# Patient Record
Sex: Female | Born: 1960 | Race: Black or African American | Hispanic: No | Marital: Single | Smoking: Current every day smoker
Health system: Southern US, Community
[De-identification: ages and names within clinical notes are randomized; demographics above are authoritative.]

## PROBLEM LIST (undated history)

## (undated) DIAGNOSIS — J45909 Unspecified asthma, uncomplicated: Secondary | ICD-10-CM

## (undated) DIAGNOSIS — I10 Essential (primary) hypertension: Secondary | ICD-10-CM

## (undated) DIAGNOSIS — R002 Palpitations: Secondary | ICD-10-CM

## (undated) DIAGNOSIS — Z889 Allergy status to unspecified drugs, medicaments and biological substances status: Secondary | ICD-10-CM

## (undated) DIAGNOSIS — M3500 Sicca syndrome, unspecified: Secondary | ICD-10-CM

## (undated) HISTORY — PX: CHOLECYSTECTOMY: SHX55

## (undated) HISTORY — PX: NO PAST SURGERIES: SHX2092

---

## 2003-12-17 ENCOUNTER — Emergency Department: Payer: Self-pay | Admitting: Emergency Medicine

## 2004-01-24 ENCOUNTER — Emergency Department: Payer: Self-pay | Admitting: General Practice

## 2004-05-10 ENCOUNTER — Emergency Department: Payer: Self-pay | Admitting: Internal Medicine

## 2004-06-14 ENCOUNTER — Emergency Department: Payer: Self-pay | Admitting: Emergency Medicine

## 2004-10-25 ENCOUNTER — Emergency Department: Payer: Self-pay | Admitting: Emergency Medicine

## 2005-11-08 ENCOUNTER — Emergency Department: Payer: Self-pay | Admitting: Emergency Medicine

## 2006-02-04 ENCOUNTER — Emergency Department: Payer: Self-pay | Admitting: General Practice

## 2006-03-06 ENCOUNTER — Emergency Department: Payer: Self-pay | Admitting: Emergency Medicine

## 2006-06-02 ENCOUNTER — Emergency Department: Payer: Self-pay | Admitting: Emergency Medicine

## 2006-07-13 ENCOUNTER — Ambulatory Visit: Payer: Self-pay | Admitting: Internal Medicine

## 2006-07-25 ENCOUNTER — Ambulatory Visit: Payer: Self-pay | Admitting: Emergency Medicine

## 2006-11-11 ENCOUNTER — Ambulatory Visit: Payer: Self-pay | Admitting: Internal Medicine

## 2006-12-26 ENCOUNTER — Ambulatory Visit: Payer: Self-pay | Admitting: Family Medicine

## 2007-01-04 ENCOUNTER — Ambulatory Visit: Payer: Self-pay | Admitting: Family Medicine

## 2007-02-04 ENCOUNTER — Ambulatory Visit: Payer: Self-pay | Admitting: Internal Medicine

## 2007-02-24 ENCOUNTER — Ambulatory Visit: Payer: Self-pay | Admitting: Emergency Medicine

## 2007-04-23 ENCOUNTER — Ambulatory Visit: Payer: Self-pay | Admitting: Otolaryngology

## 2007-07-08 ENCOUNTER — Ambulatory Visit: Payer: Self-pay | Admitting: Sports Medicine

## 2007-10-27 ENCOUNTER — Ambulatory Visit: Payer: Self-pay | Admitting: Internal Medicine

## 2008-03-19 ENCOUNTER — Emergency Department: Payer: Self-pay | Admitting: Emergency Medicine

## 2008-10-31 ENCOUNTER — Ambulatory Visit: Payer: Self-pay | Admitting: Family Medicine

## 2009-07-13 ENCOUNTER — Ambulatory Visit: Payer: Self-pay | Admitting: Internal Medicine

## 2009-12-08 ENCOUNTER — Ambulatory Visit: Payer: Self-pay | Admitting: Internal Medicine

## 2010-02-19 ENCOUNTER — Observation Stay: Payer: Self-pay | Admitting: Specialist

## 2010-02-19 DIAGNOSIS — R55 Syncope and collapse: Secondary | ICD-10-CM

## 2010-03-27 DIAGNOSIS — M76899 Other specified enthesopathies of unspecified lower limb, excluding foot: Secondary | ICD-10-CM | POA: Insufficient documentation

## 2010-04-23 ENCOUNTER — Ambulatory Visit: Payer: Self-pay | Admitting: Internal Medicine

## 2010-12-30 ENCOUNTER — Emergency Department: Payer: Self-pay | Admitting: Unknown Physician Specialty

## 2011-03-24 ENCOUNTER — Emergency Department: Payer: Self-pay | Admitting: Emergency Medicine

## 2011-03-24 LAB — CBC
HCT: 38.2 % (ref 35.0–47.0)
HGB: 12.7 g/dL (ref 12.0–16.0)
MCH: 30.5 pg (ref 26.0–34.0)
MCHC: 33.2 g/dL (ref 32.0–36.0)
MCV: 92 fL (ref 80–100)
RDW: 14 % (ref 11.5–14.5)

## 2011-03-24 LAB — TROPONIN I: Troponin-I: <0.02 ng/mL

## 2011-03-24 LAB — BASIC METABOLIC PANEL
Anion Gap: 13 (ref 7–16)
BUN: 6 mg/dL — ABNORMAL LOW (ref 7–18)
Chloride: 106 mmol/L (ref 98–107)
Glucose: 106 mg/dL — ABNORMAL HIGH (ref 65–99)
Osmolality: 283 (ref 275–301)

## 2011-03-24 LAB — LIPASE, BLOOD: Lipase: 135 U/L (ref 73–393)

## 2011-04-27 ENCOUNTER — Emergency Department: Payer: Self-pay | Admitting: Emergency Medicine

## 2011-04-28 LAB — BASIC METABOLIC PANEL
BUN: 9 mg/dL (ref 7–18)
Calcium, Total: 8.9 mg/dL (ref 8.5–10.1)
Chloride: 107 mmol/L (ref 98–107)
Creatinine: 0.68 mg/dL (ref 0.60–1.30)
EGFR (African American): >60
EGFR (Non-African Amer.): >60
Potassium: 3.8 mmol/L (ref 3.5–5.1)
Sodium: 142 mmol/L (ref 136–145)

## 2011-04-28 LAB — CBC
MCHC: 33.5 g/dL (ref 32.0–36.0)
MCV: 92 fL (ref 80–100)
RBC: 4.3 10*6/uL (ref 3.80–5.20)
RDW: 13.8 % (ref 11.5–14.5)
WBC: 6.9 10*3/uL (ref 3.6–11.0)

## 2011-04-28 LAB — URINALYSIS, COMPLETE
Bilirubin,UR: NEGATIVE
Ketone: NEGATIVE
Specific Gravity: 1.013 (ref 1.003–1.030)
Squamous Epithelial: 5
WBC UR: 15 /HPF (ref 0–5)

## 2011-04-28 LAB — WET PREP, GENITAL

## 2011-07-09 ENCOUNTER — Ambulatory Visit: Payer: Self-pay | Admitting: Internal Medicine

## 2011-07-09 LAB — URINALYSIS, COMPLETE
Glucose,UR: NEGATIVE mg/dL (ref 0–75)
Ketone: NEGATIVE
Nitrite: NEGATIVE

## 2011-07-11 LAB — URINE CULTURE

## 2012-04-27 DIAGNOSIS — G4700 Insomnia, unspecified: Secondary | ICD-10-CM | POA: Insufficient documentation

## 2012-04-27 DIAGNOSIS — F329 Major depressive disorder, single episode, unspecified: Secondary | ICD-10-CM | POA: Insufficient documentation

## 2012-05-04 DIAGNOSIS — F332 Major depressive disorder, recurrent severe without psychotic features: Secondary | ICD-10-CM | POA: Insufficient documentation

## 2012-11-22 ENCOUNTER — Ambulatory Visit: Payer: Self-pay | Admitting: Physician Assistant

## 2013-04-01 DIAGNOSIS — F3289 Other specified depressive episodes: Secondary | ICD-10-CM | POA: Diagnosis not present

## 2013-04-01 DIAGNOSIS — M2240 Chondromalacia patellae, unspecified knee: Secondary | ICD-10-CM | POA: Diagnosis not present

## 2013-04-01 DIAGNOSIS — F329 Major depressive disorder, single episode, unspecified: Secondary | ICD-10-CM | POA: Diagnosis not present

## 2013-04-01 DIAGNOSIS — Z79899 Other long term (current) drug therapy: Secondary | ICD-10-CM | POA: Diagnosis not present

## 2013-04-01 DIAGNOSIS — M25569 Pain in unspecified knee: Secondary | ICD-10-CM | POA: Diagnosis not present

## 2013-04-01 DIAGNOSIS — IMO0002 Reserved for concepts with insufficient information to code with codable children: Secondary | ICD-10-CM | POA: Diagnosis not present

## 2013-04-01 DIAGNOSIS — J410 Simple chronic bronchitis: Secondary | ICD-10-CM | POA: Insufficient documentation

## 2013-04-01 DIAGNOSIS — M898X9 Other specified disorders of bone, unspecified site: Secondary | ICD-10-CM | POA: Diagnosis not present

## 2013-04-01 DIAGNOSIS — M7989 Other specified soft tissue disorders: Secondary | ICD-10-CM | POA: Diagnosis not present

## 2013-04-01 DIAGNOSIS — F17200 Nicotine dependence, unspecified, uncomplicated: Secondary | ICD-10-CM | POA: Insufficient documentation

## 2013-04-01 DIAGNOSIS — I872 Venous insufficiency (chronic) (peripheral): Secondary | ICD-10-CM | POA: Insufficient documentation

## 2013-04-01 DIAGNOSIS — M1710 Unilateral primary osteoarthritis, unspecified knee: Secondary | ICD-10-CM | POA: Diagnosis not present

## 2013-04-01 DIAGNOSIS — E663 Overweight: Secondary | ICD-10-CM | POA: Diagnosis not present

## 2013-04-01 DIAGNOSIS — J40 Bronchitis, not specified as acute or chronic: Secondary | ICD-10-CM | POA: Insufficient documentation

## 2013-04-01 DIAGNOSIS — M25469 Effusion, unspecified knee: Secondary | ICD-10-CM | POA: Diagnosis not present

## 2013-04-01 DIAGNOSIS — E669 Obesity, unspecified: Secondary | ICD-10-CM | POA: Insufficient documentation

## 2013-04-01 DIAGNOSIS — R609 Edema, unspecified: Secondary | ICD-10-CM | POA: Diagnosis not present

## 2013-05-11 DIAGNOSIS — H1045 Other chronic allergic conjunctivitis: Secondary | ICD-10-CM | POA: Diagnosis not present

## 2014-02-16 ENCOUNTER — Ambulatory Visit: Payer: Self-pay | Admitting: Physician Assistant

## 2014-02-16 DIAGNOSIS — J329 Chronic sinusitis, unspecified: Secondary | ICD-10-CM | POA: Diagnosis not present

## 2014-03-21 ENCOUNTER — Ambulatory Visit: Payer: Self-pay | Admitting: Physician Assistant

## 2014-03-21 DIAGNOSIS — Y92009 Unspecified place in unspecified non-institutional (private) residence as the place of occurrence of the external cause: Secondary | ICD-10-CM | POA: Diagnosis not present

## 2014-03-21 DIAGNOSIS — S79912A Unspecified injury of left hip, initial encounter: Secondary | ICD-10-CM | POA: Diagnosis not present

## 2014-03-21 DIAGNOSIS — Y999 Unspecified external cause status: Secondary | ICD-10-CM | POA: Diagnosis not present

## 2014-03-21 DIAGNOSIS — Y9389 Activity, other specified: Secondary | ICD-10-CM | POA: Diagnosis not present

## 2014-03-21 DIAGNOSIS — M25552 Pain in left hip: Secondary | ICD-10-CM | POA: Diagnosis not present

## 2014-03-21 DIAGNOSIS — F418 Other specified anxiety disorders: Secondary | ICD-10-CM | POA: Diagnosis not present

## 2014-03-21 DIAGNOSIS — X58XXXA Exposure to other specified factors, initial encounter: Secondary | ICD-10-CM | POA: Diagnosis not present

## 2014-07-09 ENCOUNTER — Encounter: Payer: Self-pay | Admitting: *Deleted

## 2014-07-09 ENCOUNTER — Emergency Department: Payer: Medicare Other

## 2014-07-09 ENCOUNTER — Emergency Department
Admission: EM | Admit: 2014-07-09 | Discharge: 2014-07-10 | Disposition: A | Discharge status: Home / Self Care | Payer: Medicare Other | Attending: Emergency Medicine | Admitting: Emergency Medicine

## 2014-07-09 ENCOUNTER — Other Ambulatory Visit: Payer: Self-pay

## 2014-07-09 DIAGNOSIS — R002 Palpitations: Secondary | ICD-10-CM | POA: Diagnosis not present

## 2014-07-09 DIAGNOSIS — R0789 Other chest pain: Secondary | ICD-10-CM | POA: Insufficient documentation

## 2014-07-09 DIAGNOSIS — R0602 Shortness of breath: Secondary | ICD-10-CM | POA: Diagnosis not present

## 2014-07-09 DIAGNOSIS — Z720 Tobacco use: Secondary | ICD-10-CM | POA: Diagnosis not present

## 2014-07-09 DIAGNOSIS — R079 Chest pain, unspecified: Secondary | ICD-10-CM

## 2014-07-09 LAB — COMPREHENSIVE METABOLIC PANEL
ALK PHOS: 59 U/L (ref 38–126)
ALT: 27 U/L (ref 14–54)
AST: 30 U/L (ref 15–41)
Albumin: 3.8 g/dL (ref 3.5–5.0)
Anion gap: 8 (ref 5–15)
BILIRUBIN TOTAL: 0.3 mg/dL (ref 0.3–1.2)
BUN: 12 mg/dL (ref 6–20)
CALCIUM: 8.7 mg/dL — AB (ref 8.9–10.3)
CO2: 24 mmol/L (ref 22–32)
Chloride: 103 mmol/L (ref 101–111)
Creatinine, Ser: 0.76 mg/dL (ref 0.44–1.00)
GFR calc Af Amer: >60 mL/min (ref >60)
GFR calc non Af Amer: >60 mL/min (ref >60)
Glucose, Bld: 91 mg/dL (ref 65–99)
Potassium: 3.8 mmol/L (ref 3.5–5.1)
SODIUM: 135 mmol/L (ref 135–145)
Total Protein: 7.3 g/dL (ref 6.5–8.1)

## 2014-07-09 LAB — URINALYSIS COMPLETE WITH MICROSCOPIC (ARMC ONLY)
BACTERIA UA: NONE SEEN
Bilirubin Urine: NEGATIVE
GLUCOSE, UA: NEGATIVE mg/dL
HGB URINE DIPSTICK: NEGATIVE
Ketones, ur: NEGATIVE mg/dL
NITRITE: NEGATIVE
PH: 6 (ref 5.0–8.0)
Protein, ur: NEGATIVE mg/dL
RBC / HPF: NONE SEEN RBC/hpf (ref 0–5)
Specific Gravity, Urine: 1.004 — ABNORMAL LOW (ref 1.005–1.030)

## 2014-07-09 LAB — CBC
HEMATOCRIT: 42.2 % (ref 35.0–47.0)
Hemoglobin: 14 g/dL (ref 12.0–16.0)
MCH: 29.8 pg (ref 26.0–34.0)
MCHC: 33.1 g/dL (ref 32.0–36.0)
MCV: 89.8 fL (ref 80.0–100.0)
Platelets: 312 10*3/uL (ref 150–440)
RBC: 4.7 MIL/uL (ref 3.80–5.20)
RDW: 14.1 % (ref 11.5–14.5)
WBC: 9.1 10*3/uL (ref 3.6–11.0)

## 2014-07-09 LAB — TROPONIN I: Troponin I: <0.03 ng/mL (ref <0.031)

## 2014-07-09 NOTE — ED Notes (Signed)
Pt presents w/ present c/o palpitations. Pt started having chest pressure while mopping floor at home. Pt was dyspneic and diaphoretic at that time. Pt presently denies pain, continues to c/o "fluttering" and slight dyspnea. Pt is presently not diaphoretic but is pale.

## 2014-07-10 ENCOUNTER — Other Ambulatory Visit: Payer: Self-pay

## 2014-07-10 LAB — TROPONIN I

## 2014-07-10 NOTE — ED Notes (Signed)
RN called lab to determine status of troponin that was sent at 0253.  Lab stated that sample was hemolyzed, no called to tell staff that another sample was needed. MD aware.

## 2014-07-10 NOTE — ED Notes (Signed)
Lab called and now stated that they can run sample.

## 2014-07-10 NOTE — ED Provider Notes (Signed)
[facility] Emergency Department Provider Note  ____________________________________________  Time seen: Approximately 2:03 AM  I have reviewed the triage vital signs and the nursing notes.   HISTORY  Chief Complaint Palpitations    HPI Karen Reed is a 54 y.o. female with a medical history only significant for tobacco abuse who presents after acute onset of palpitations, chest pressure, shortness of breath, and diaphoresis while mopping her floor.  She has never had symptoms like that in the past.  She felt like she got overheated while mopping, but her symptoms seemed more severe to her that she anticipated.  She describes the overall severity as moderate to severe at its worst.  The chest pressure lasted about 30 minutes until it resolved but the fluttering or palpitation continued for some time.She currently feels much better and is asymptomatic.   History reviewed. No pertinent past medical history.  There are no active problems to display for this patient.   History reviewed. No pertinent past surgical history.  No current outpatient prescriptions on file.  Allergies Codeine and Sulfa antibiotics  History reviewed. No pertinent family history.  Social History History  Substance Use Topics  . Smoking status: Current Every Day Smoker    Types: Cigarettes  . Smokeless tobacco: Never Used  . Alcohol Use: No    Review of Systems Constitutional: No fever/chills.  Diaphoresis during her episode Eyes: No visual changes. ENT: No sore throat. Cardiovascular: Moderate chest pressure lasting about 30 minutes. Respiratory: Shortness of breath while she was having palpitations. Gastrointestinal: No abdominal pain.  No nausea, no vomiting.  No diarrhea.  No constipation. Genitourinary: Negative for dysuria. Musculoskeletal: Negative for back pain. Skin: Negative for rash. Neurological: Negative for headaches, focal weakness or  numbness.  10-point ROS otherwise negative.  ____________________________________________   PHYSICAL EXAM:  VITAL SIGNS: ED Triage Vitals  Enc Vitals Group     BP 07/09/14 2253 132/94 mmHg     Pulse Rate 07/09/14 2253 111     Resp 07/09/14 2253 22     Temp 07/09/14 2253 98.9 F (37.2 C)     Temp Source 07/09/14 2253 Oral     SpO2 07/09/14 2253 96 %     Weight 07/09/14 2253 225 lb (102.059 kg)     Height 07/09/14 2253  (1.727 m)     Head Cir --      Peak Flow --      Pain Score --      Pain Loc --      Pain Edu? --      Excl. in GC? --     Constitutional: Alert and oriented. Well appearing and in no acute distress. Eyes: Conjunctivae are normal. PERRL. EOMI. Head: Atraumatic. Nose: No congestion/rhinnorhea. Mouth/Throat: Mucous membranes are moist.  Oropharynx non-erythematous. Neck: No stridor.   Cardiovascular: Normal rate, regular rhythm. Grossly normal heart sounds.  Good peripheral circulation. Respiratory: Normal respiratory effort.  No retractions. Lungs CTAB. Gastrointestinal: Soft and nontender. No distention. No abdominal bruits. No CVA tenderness. Musculoskeletal: No lower extremity tenderness nor edema.  No joint effusions. Neurologic:  Normal speech and language. No gross focal neurologic deficits are appreciated. Speech is normal. Skin:  Skin is warm, dry and intact. No rash noted. Psychiatric: Mood and affect are normal. Speech and behavior are normal.  ____________________________________________   LABS (all labs ordered are listed, but only abnormal results are displayed)  Labs Reviewed  COMPREHENSIVE METABOLIC PANEL - Abnormal; Notable for the following:  Calcium 8.7 (*)    All other components within normal limits  URINALYSIS COMPLETEWITH MICROSCOPIC ([facility] ONLY) - Abnormal; Notable for the following:    Color, Urine STRAW (*)    APPearance CLEAR (*)    Specific Gravity, Urine 1.004 (*)    Leukocytes, UA TRACE (*)    Squamous  Epithelial / LPF 0-5 (*)    All other components within normal limits  CBC  TROPONIN I  TROPONIN I   ____________________________________________  EKG  ED ECG REPORT I, Elder Davidian, the attending physician, personally viewed and interpreted this ECG.   Date: 07/09/2014  EKG Time: 22:55  Rate: 107  Rhythm: Accelerated junctional rhythm  Axis: Normal  Intervals:Cannot appreciate P waves  ST&T Change: Inverted T-wave in lead 3, otherwise unremarkable with no evidence of acute ischemia.  ED ECG REPORT #2 I, Grete Bosko, the attending physician, personally viewed and interpreted this ECG.   Date: 07/10/2014  EKG Time: 02:49  Rate: 79  Rhythm: normal sinus rhythm  Axis: Normal  Intervals:Normal  ST&T Change: None; lead 3 is very low voltage and is difficult to appreciate the T-wave  ____________________________________________  RADIOLOGY  I, Augusten Lipkin, personally viewed and evaluated these images as part of my medical decision making.   Dg Chest 2 View  07/09/2014   CLINICAL DATA:  Shortness of breath after working outside today. Smoker.  EXAM: CHEST  2 VIEW  COMPARISON:  Chest radiograph April 24, 2011  FINDINGS: Cardiomediastinal silhouette is normal. Diffuse bronchitic changes, no pleural effusion or focal consolidation. Normal lung volumes. No pneumothorax. Soft tissue planes and included osseous structure nonsuspicious.  IMPRESSION: Bronchitic changes without focal consolidation.   Electronically Signed   By: Awilda Metro M.D.   On: 07/09/2014 23:53    ____________________________________________   PROCEDURES  Procedure(s) performed: None  Critical Care performed: No ____________________________________________   INITIAL IMPRESSION / ASSESSMENT AND PLAN / ED COURSE  Pertinent labs & imaging results that were available during my care of the patient were reviewed by me and considered in my medical decision making (see chart for details).  The  patient has a HEART score of 3-4 based upon whether her history is considered moderately or highly suspicious.  This places her between low and moderate risk.  Her symptoms may represent unstable angina.  On the other hand, they may represent an episode of SVT, particularly since palpitations and fluttering are not atypical complaint associated with ACS.  I have discussed with her extensively the risks and benefits of chest pain observation in the hospital versus close outpatient follow-up.  We decided upon a second troponin and further observation in the emergency department.  If she remains asymptomatic, we will likely discharge with close outpatient follow-up on Monday.  She took 2 full dose aspirin prior to arrival in the emergency department.  She is hemodynamically stable.  ----------------------------------------- 4:29 AM on 07/10/2014 -----------------------------------------  The patient's second troponin is negative.  She states that she has been asymptomatic for the last several hours while awaiting her lab results.  I reiterated my offer to bring her into the hospital for chest pain, but she remains comfortable with the plan for outpatient follow-up, and I believe this is appropriate under the circumstances.  I trust that she will follow up as recommended.  I gave her my usual and customary return precautions.  ____________________________________________  FINAL CLINICAL IMPRESSION(S) / ED DIAGNOSES  Final diagnoses:  Chest pain, unspecified chest pain type  Heart palpitations  NEW MEDICATIONS STARTED DURING THIS VISIT:  New Prescriptions   No medications on file     Loleta Rose, MD 07/10/14 [phone]

## 2014-07-10 NOTE — Discharge Instructions (Signed)
You have been seen in the Emergency Department (ED) today for chest pressure and palpitations.  As we have discussed todays test results are normal, but you may require further testing.  Please follow up with the recommended doctor as instructed above in these documents regarding todays emergent visit and your recent symptoms to discuss further management.  Continue to take your regular medications. If you are not doing so already, please also take a daily baby aspirin (81 mg), at least until you follow up with your doctor.  Return to the Emergency Department (ED) if you experience any further chest pain/pressure/tightness, difficulty breathing, or sudden sweating, or other symptoms that concern you.   Chest Pain (Nonspecific) It is often hard to give a specific diagnosis for the cause of chest pain. There is always a chance that your pain could be related to something serious, such as a heart attack or a blood clot in the lungs. You need to follow up with your health care provider for further evaluation. CAUSES   Heartburn.  Pneumonia or bronchitis.  Anxiety or stress.  Inflammation around your heart (pericarditis) or lung (pleuritis or pleurisy).  A blood clot in the lung.  A collapsed lung (pneumothorax). It can develop suddenly on its own (spontaneous pneumothorax) or from trauma to the chest.  Shingles infection (herpes zoster virus). The chest wall is composed of bones, muscles, and cartilage. Any of these can be the source of the pain.  The bones can be bruised by injury.  The muscles or cartilage can be strained by coughing or overwork.  The cartilage can be affected by inflammation and become sore (costochondritis). DIAGNOSIS  Lab tests or other studies may be needed to find the cause of your pain. Your health care provider may have you take a test called an ambulatory electrocardiogram (ECG). An ECG records your heartbeat patterns over a 24-hour period. You may also have  other tests, such as:  Transthoracic echocardiogram (TTE). During echocardiography, sound waves are used to evaluate how blood flows through your heart.  Transesophageal echocardiogram (TEE).  Cardiac monitoring. This allows your health care provider to monitor your heart rate and rhythm in real time.  Holter monitor. This is a portable device that records your heartbeat and can help diagnose heart arrhythmias. It allows your health care provider to track your heart activity for several days, if needed.  Stress tests by exercise or by giving medicine that makes the heart beat faster. TREATMENT   Treatment depends on what may be causing your chest pain. Treatment may include:  Acid blockers for heartburn.  Anti-inflammatory medicine.  Pain medicine for inflammatory conditions.  Antibiotics if an infection is present.  You may be advised to change lifestyle habits. This includes stopping smoking and avoiding alcohol, caffeine, and chocolate.  You may be advised to keep your head raised (elevated) when sleeping. This reduces the chance of acid going backward from your stomach into your esophagus. Most of the time, nonspecific chest pain will improve within 2-3 days with rest and mild pain medicine.  HOME CARE INSTRUCTIONS   If antibiotics were prescribed, take them as directed. Finish them even if you start to feel better.  For the next few days, avoid physical activities that bring on chest pain. Continue physical activities as directed.  Do not use any tobacco products, including cigarettes, chewing tobacco, or electronic cigarettes.  Avoid drinking alcohol.  Only take medicine as directed by your health care provider.  Follow your health care provider's  suggestions for further testing if your chest pain does not go away.  Keep any follow-up appointments you made. If you do not go to an appointment, you could develop lasting (chronic) problems with pain. If there is any  problem keeping an appointment, call to reschedule. SEEK MEDICAL CARE IF:   Your chest pain does not go away, even after treatment.  You have a rash with blisters on your chest.  You have a fever. SEEK IMMEDIATE MEDICAL CARE IF:   You have increased chest pain or pain that spreads to your arm, neck, jaw, back, or abdomen.  You have shortness of breath.  You have an increasing cough, or you cough up blood.  You have severe back or abdominal pain.  You feel nauseous or vomit.  You have severe weakness.  You faint.  You have chills. This is an emergency. Do not wait to see if the pain will go away. Get medical help at once. Call your local emergency services (911 in [place] Do not drive yourself to the hospital. MAKE SURE YOU:   Understand these instructions.  Will watch your condition.  Will get help right away if you are not doing well or get worse. Document Released: 10/10/2004 Document Revised: 01/05/2013 Document Reviewed: 08/06/2007 [facility] Patient Information 2015 [place], [place]. This information is not intended to replace advice given to you by your health care provider. Make sure you discuss any questions you have with your health care provider.  Palpitations A palpitation is the feeling that your heartbeat is irregular. It may feel like your heart is fluttering or skipping a beat. It may also feel like your heart is beating faster than normal. This is usually not a serious problem. In some cases, you may need more medical tests. HOME CARE  Avoid:  Caffeine in coffee, tea, soft drinks, diet pills, and energy drinks.  Chocolate.  Alcohol.  Stop smoking if you smoke.  Reduce your stress and anxiety. Try:  A method that measures bodily functions so you can learn to control them (biofeedback).  Yoga.  Meditation.  Physical activity such as swimming, jogging, or walking.  Get plenty of rest and sleep. GET HELP IF:  Your fast or irregular heartbeat  continues after 24 hours.  Your palpitations occur more often. GET HELP RIGHT AWAY IF:   You have chest pain.  You feel short of breath.  You have a very bad headache.  You feel dizzy or pass out (faint). MAKE SURE YOU:   Understand these instructions.  Will watch your condition.  Will get help right away if you are not doing well or get worse. Document Released: 10/10/2007 Document Revised: 05/17/2013 Document Reviewed: 03/01/2011 [facility] Patient Information 2015 [place], [place]. This information is not intended to replace advice given to you by your health care provider. Make sure you discuss any questions you have with your health care provider.

## 2014-07-10 NOTE — ED Notes (Signed)
Pt alert and in NAD at time of d/c 

## 2014-07-11 DIAGNOSIS — Z720 Tobacco use: Secondary | ICD-10-CM | POA: Diagnosis not present

## 2014-07-11 DIAGNOSIS — R002 Palpitations: Secondary | ICD-10-CM | POA: Diagnosis not present

## 2014-07-11 DIAGNOSIS — R079 Chest pain, unspecified: Secondary | ICD-10-CM | POA: Insufficient documentation

## 2014-07-15 DIAGNOSIS — R002 Palpitations: Secondary | ICD-10-CM | POA: Diagnosis not present

## 2014-07-22 DIAGNOSIS — R079 Chest pain, unspecified: Secondary | ICD-10-CM | POA: Diagnosis not present

## 2014-08-04 DIAGNOSIS — R002 Palpitations: Secondary | ICD-10-CM | POA: Diagnosis not present

## 2014-08-04 DIAGNOSIS — R079 Chest pain, unspecified: Secondary | ICD-10-CM | POA: Diagnosis not present

## 2015-01-06 ENCOUNTER — Ambulatory Visit
Admission: EM | Admit: 2015-01-06 | Discharge: 2015-01-06 | Disposition: A | Discharge status: Home / Self Care | Payer: Medicare Other | Attending: Family Medicine | Admitting: Family Medicine

## 2015-01-06 ENCOUNTER — Encounter: Payer: Self-pay | Admitting: Emergency Medicine

## 2015-01-06 DIAGNOSIS — J069 Acute upper respiratory infection, unspecified: Secondary | ICD-10-CM | POA: Diagnosis not present

## 2015-01-06 MED ORDER — HYDROCOD POLST-CPM POLST ER 10-8 MG/5ML PO SUER: 5.0000 mL | Freq: Two times a day (BID) | ORAL | Status: DC

## 2015-01-06 MED ORDER — FLUTICASONE PROPIONATE 50 MCG/ACT NA SUSP: 2.0000 | Freq: Every day | NASAL | Status: DC

## 2015-01-06 NOTE — ED Notes (Signed)
Patient c/o sinus pressure and congestion and nasal congestion and HAs since yesterday.  Patient denies fevers.

## 2015-01-06 NOTE — Discharge Instructions (Signed)
Cool Mist Vaporizers °Vaporizers may help relieve the symptoms of a cough and cold. They add moisture to the air, which helps mucus to become thinner and less sticky. This makes it easier to breathe and cough up secretions. Cool mist vaporizers do not cause serious burns like hot mist vaporizers, which may also be called steamers or humidifiers. Vaporizers have not been proven to help with colds. You should not use a vaporizer if you are allergic to mold. °HOME CARE INSTRUCTIONS °· Follow the package instructions for the vaporizer. °· Do not use anything other than distilled water in the vaporizer. °· Do not run the vaporizer all of the time. This can cause mold or bacteria to grow in the vaporizer. °· Clean the vaporizer after each time it is used. °· Clean and dry the vaporizer well before storing it. °· Stop using the vaporizer if worsening respiratory symptoms develop. °  °This information is not intended to replace advice given to you by your health care provider. Make sure you discuss any questions you have with your health care provider. °  °Document Released: 09/28/2003 Document Revised: 01/05/2013 Document Reviewed: 05/20/2012 °Elsevier Interactive Patient Education ©2016 Elsevier Inc. ° °Upper Respiratory Infection, Adult °Most upper respiratory infections (URIs) are a viral infection of the air passages leading to the lungs. A URI affects the nose, throat, and upper air passages. The most common type of URI is nasopharyngitis and is typically referred to as "the common cold." °URIs run their course and usually go away on their own. Most of the time, a URI does not require medical attention, but sometimes a bacterial infection in the upper airways can follow a viral infection. This is called a secondary infection. Sinus and middle ear infections are common types of secondary upper respiratory infections. °Bacterial pneumonia can also complicate a URI. A URI can worsen asthma and chronic obstructive  pulmonary disease (COPD). Sometimes, these complications can require emergency medical care and may be life threatening.  °CAUSES °Almost all URIs are caused by viruses. A virus is a type of germ and can spread from one person to another.  °RISKS FACTORS °You may be at risk for a URI if:  °· You smoke.   °· You have chronic heart or lung disease. °· You have a weakened defense (immune) system.   °· You are very young or very old.   °· You have nasal allergies or asthma. °· You work in crowded or poorly ventilated areas. °· You work in health care facilities or schools. °SIGNS AND SYMPTOMS  °Symptoms typically develop 2-3 days after you come in contact with a cold virus. Most viral URIs last 7-10 days. However, viral URIs from the influenza virus (flu virus) can last 14-18 days and are typically more severe. Symptoms may include:  °· Runny or stuffy (congested) nose.   °· Sneezing.   °· Cough.   °· Sore throat.   °· Headache.   °· Fatigue.   °· Fever.   °· Loss of appetite.   °· Pain in your forehead, behind your eyes, and over your cheekbones (sinus pain). °· Muscle aches.   °DIAGNOSIS  °Your health care provider may diagnose a URI by: °· Physical exam. °· Tests to check that your symptoms are not due to another condition such as: °¨ Strep throat. °¨ Sinusitis. °¨ Pneumonia. °¨ Asthma. °TREATMENT  °A URI goes away on its own with time. It cannot be cured with medicines, but medicines may be prescribed or recommended to relieve symptoms. Medicines may help: °· Reduce your fever. °· Reduce   your cough.  Relieve nasal congestion. HOME CARE INSTRUCTIONS   Take medicines only as directed by your health care provider.   Gargle warm saltwater or take cough drops to comfort your throat as directed by your health care provider.  Use a warm mist humidifier or inhale steam from a shower to increase air moisture. This may make it easier to breathe.  Drink enough fluid to keep your urine clear or pale yellow.   Eat  soups and other clear broths and maintain good nutrition.   Rest as needed.   Return to work when your temperature has returned to normal or as your health care provider advises. You may need to stay home longer to avoid infecting others. You can also use a face mask and careful hand washing to prevent spread of the virus.  Increase the usage of your inhaler if you have asthma.   Do not use any tobacco products, including cigarettes, chewing tobacco, or electronic cigarettes. If you need help quitting, ask your health care provider. PREVENTION  The best way to protect yourself from getting a cold is to practice good hygiene.   Avoid oral or hand contact with people with cold symptoms.   Wash your hands often if contact occurs.  There is no clear evidence that vitamin C, vitamin E, echinacea, or exercise reduces the chance of developing a cold. However, it is always recommended to get plenty of rest, exercise, and practice good nutrition.  SEEK MEDICAL CARE IF:   You are getting worse rather than better.   Your symptoms are not controlled by medicine.   You have chills.  You have worsening shortness of breath.  You have brown or red mucus.  You have yellow or brown nasal discharge.  You have pain in your face, especially when you bend forward.  You have a fever.  You have swollen neck glands.  You have pain while swallowing.  You have white areas in the back of your throat. SEEK IMMEDIATE MEDICAL CARE IF:   You have severe or persistent:  Headache.  Ear pain.  Sinus pain.  Chest pain.  You have chronic lung disease and any of the following:  Wheezing.  Prolonged cough.  Coughing up blood.  A change in your usual mucus.  You have a stiff neck.  You have changes in your:  Vision.  Hearing.  Thinking.  Mood. MAKE SURE YOU:   Understand these instructions.  Will watch your condition.  Will get help right away if you are not doing well or  get worse.   This information is not intended to replace advice given to you by your health care provider. Make sure you discuss any questions you have with your health care provider.   Document Released: 06/26/2000 Document Revised: 05/17/2014 Document Reviewed: 04/07/2013 Elsevier Interactive Patient Education 2016 Elsevier Inc.  Upper Respiratory Infection, Adult Most upper respiratory infections (URIs) are a viral infection of the air passages leading to the lungs. A URI affects the nose, throat, and upper air passages. The most common type of URI is nasopharyngitis and is typically referred to as "the common cold." URIs run their course and usually go away on their own. Most of the time, a URI does not require medical attention, but sometimes a bacterial infection in the upper airways can follow a viral infection. This is called a secondary infection. Sinus and middle ear infections are common types of secondary upper respiratory infections. Bacterial pneumonia can also complicate a URI.  A URI can worsen asthma and chronic obstructive pulmonary disease (COPD). Sometimes, these complications can require emergency medical care and may be life threatening.  CAUSES Almost all URIs are caused by viruses. A virus is a type of germ and can spread from one person to another.  RISKS FACTORS You may be at risk for a URI if:   You smoke.   You have chronic heart or lung disease.  You have a weakened defense (immune) system.   You are very young or very old.   You have nasal allergies or asthma.  You work in crowded or poorly ventilated areas.  You work in health care facilities or schools. SIGNS AND SYMPTOMS  Symptoms typically develop 2-3 days after you come in contact with a cold virus. Most viral URIs last 7-10 days. However, viral URIs from the influenza virus (flu virus) can last 14-18 days and are typically more severe. Symptoms may include:   Runny or stuffy (congested) nose.    Sneezing.   Cough.   Sore throat.   Headache.   Fatigue.   Fever.   Loss of appetite.   Pain in your forehead, behind your eyes, and over your cheekbones (sinus pain).  Muscle aches.  DIAGNOSIS  Your health care provider may diagnose a URI by:  Physical exam.  Tests to check that your symptoms are not due to another condition such as:  Strep throat.  Sinusitis.  Pneumonia.  Asthma. TREATMENT  A URI goes away on its own with time. It cannot be cured with medicines, but medicines may be prescribed or recommended to relieve symptoms. Medicines may help:  Reduce your fever.  Reduce your cough.  Relieve nasal congestion. HOME CARE INSTRUCTIONS   Take medicines only as directed by your health care provider.   Gargle warm saltwater or take cough drops to comfort your throat as directed by your health care provider.  Use a warm mist humidifier or inhale steam from a shower to increase air moisture. This may make it easier to breathe.  Drink enough fluid to keep your urine clear or pale yellow.   Eat soups and other clear broths and maintain good nutrition.   Rest as needed.   Return to work when your temperature has returned to normal or as your health care provider advises. You may need to stay home longer to avoid infecting others. You can also use a face mask and careful hand washing to prevent spread of the virus.  Increase the usage of your inhaler if you have asthma.   Do not use any tobacco products, including cigarettes, chewing tobacco, or electronic cigarettes. If you need help quitting, ask your health care provider. PREVENTION  The best way to protect yourself from getting a cold is to practice good hygiene.   Avoid oral or hand contact with people with cold symptoms.   Wash your hands often if contact occurs.  There is no clear evidence that vitamin C, vitamin E, echinacea, or exercise reduces the chance of developing a cold.  However, it is always recommended to get plenty of rest, exercise, and practice good nutrition.  SEEK MEDICAL CARE IF:   You are getting worse rather than better.   Your symptoms are not controlled by medicine.   You have chills.  You have worsening shortness of breath.  You have brown or red mucus.  You have yellow or brown nasal discharge.  You have pain in your face, especially when you bend forward.  You have a fever.  You have swollen neck glands.  You have pain while swallowing.  You have white areas in the back of your throat. SEEK IMMEDIATE MEDICAL CARE IF:   You have severe or persistent:  Headache.  Ear pain.  Sinus pain.  Chest pain.  You have chronic lung disease and any of the following:  Wheezing.  Prolonged cough.  Coughing up blood.  A change in your usual mucus.  You have a stiff neck.  You have changes in your:  Vision.  Hearing.  Thinking.  Mood. MAKE SURE YOU:   Understand these instructions.  Will watch your condition.  Will get help right away if you are not doing well or get worse.   This information is not intended to replace advice given to you by your health care provider. Make sure you discuss any questions you have with your health care provider.   Document Released: 06/26/2000 Document Revised: 05/17/2014 Document Reviewed: 04/07/2013 Elsevier Interactive Patient Education [employer]2016 Elsevier Inc.

## 2015-01-06 NOTE — ED Provider Notes (Signed)
CSN: [id number]     Arrival date & time 01/06/15  1645 History   First MD Initiated Contact with Patient 01/06/15 1735     Chief Complaint  Patient presents with  . Facial Pain  . Nasal Congestion   (Consider location/radiation/quality/duration/timing/severity/associated sxs/prior Treatment) HPI   Is a 54 year old female complains of sinus pressure and congestion and a maxillary frontal headache that she states started yesterday. Have any fevers or chills. His had some brownish discharge from her nose. In addition she's had a cough probably from dripping from above and will sometimes keep her from sleeping well. She is afebrile today pulse 82 respirations 16 blood pressure 153/94 ( taking Alka-Seltzer cold medication) and O2 sat of 100% on room air.  History reviewed. No pertinent past medical history. History reviewed. No pertinent past surgical history. History reviewed. No pertinent family history. Social History  Substance Use Topics  . Smoking status: Current Every Day Smoker -- 0.50 packs/day    Types: Cigarettes  . Smokeless tobacco: Never Used  . Alcohol Use: No   OB History    No data available     Review of Systems  Constitutional: Negative for fever, chills, diaphoresis, activity change, appetite change and fatigue.  HENT: Positive for congestion, postnasal drip, rhinorrhea and sinus pressure.   Respiratory: Positive for cough.   All other systems reviewed and are negative.   Allergies  Codeine and Sulfa antibiotics  Home Medications   Prior to Admission medications   Medication Sig Start Date End Date Taking? Authorizing Provider  chlorpheniramine-HYDROcodone (TUSSIONEX PENNKINETIC ER) 10-8 MG/5ML SUER Take 5 mLs by mouth 2 (two) times daily. 01/06/15   Lutricia FeilWilliam P Budd Frei[place], PA-C  fluticasone (FLONASE) 50 MCG/ACT nasal spray Place 2 sprays into both nostrils daily. 01/06/15   Lutricia FeilWilliam P Alayzia Pavlock, PA-C   Meds Ordered and Administered this Visit  Medications - No data  to display  BP 153/94 mmHg  Pulse 82  Temp(Src) 98.1 F (36.7 C) (Tympanic)  Resp 16  Ht 5\' 8"  (1.727 m)  Wt 225 lb (102.059 kg)  BMI 34.22 kg/m2  SpO2 100%  LMP 12/30/2014 (Approximate) No data found.   Physical Exam  Constitutional: She is oriented to person, place, and time. She appears well-developed and well-nourished. No distress.  HENT:  Head: Normocephalic and atraumatic.  Right Ear: External ear normal.  Left Ear: External ear normal.  Nose: Nose normal.  Mouth/Throat: Oropharynx is clear and moist.  Eyes: Conjunctivae are normal. Pupils are equal, round, and reactive to light.  Neck: Normal range of motion. Neck supple.  Pulmonary/Chest: Effort normal and breath sounds normal. No respiratory distress. She has no wheezes. She has no rales.  Musculoskeletal: Normal range of motion. She exhibits no edema or tenderness.  Lymphadenopathy:    She has no cervical adenopathy.  Neurological: She is alert and oriented to person, place, and time.  Skin: Skin is warm and dry. She is not diaphoretic.  Psychiatric: She has a normal mood and affect. Her behavior is normal. Judgment and thought content normal.  Nursing note and vitals reviewed.   ED Course  Procedures (including critical care time)  Labs Review Labs Reviewed - No data to display  Imaging Review No results found.   Visual Acuity Review  Right Eye Distance:   Left Eye Distance:   Bilateral Distance:    Right Eye Near:   Left Eye Near:    Bilateral Near:         MDM  1. Acute URI    Discharge Medication List as of 01/06/2015  5:53 PM    START taking these medications   Details  chlorpheniramine-HYDROcodone (TUSSIONEX PENNKINETIC ER) 10-8 MG/5ML SUER Take 5 mLs by mouth 2 (two) times daily., Starting 01/06/2015, Until Discontinued, Print    fluticasone (FLONASE) 50 MCG/ACT nasal spray Place 2 sprays into both nostrils daily., Starting 01/06/2015, Until Discontinued, Print      Plan: 1.  Diagnosis reviewed with patient 2. rx as per orders; risks, benefits, potential side effects reviewed with patient 3. Recommend supportive treatment with fluids,rest,IBU /tylenol PRN. Try Afrin for 2 days until flonase starts effect. Not allergic to hydrocodone that she knows. 4. F/u prn if symptoms worsen or don't improve     Lutricia Feil, PA-C 01/06/15 1806

## 2015-02-23 ENCOUNTER — Ambulatory Visit
Admission: EM | Admit: 2015-02-23 | Discharge: 2015-02-23 | Disposition: A | Discharge status: Home / Self Care | Payer: Medicare Other | Attending: Family Medicine | Admitting: Family Medicine

## 2015-02-23 ENCOUNTER — Encounter: Payer: Self-pay | Admitting: Emergency Medicine

## 2015-02-23 DIAGNOSIS — M545 Low back pain, unspecified: Secondary | ICD-10-CM

## 2015-02-23 DIAGNOSIS — F17210 Nicotine dependence, cigarettes, uncomplicated: Secondary | ICD-10-CM | POA: Diagnosis not present

## 2015-02-23 DIAGNOSIS — M549 Dorsalgia, unspecified: Secondary | ICD-10-CM | POA: Diagnosis present

## 2015-02-23 LAB — URINALYSIS COMPLETE WITH MICROSCOPIC (ARMC ONLY)
BACTERIA UA: NONE SEEN
Bilirubin Urine: NEGATIVE
Glucose, UA: NEGATIVE mg/dL
Ketones, ur: NEGATIVE mg/dL
Leukocytes, UA: NEGATIVE
Nitrite: NEGATIVE
PH: 7 (ref 5.0–8.0)
Protein, ur: NEGATIVE mg/dL
Specific Gravity, Urine: 1.02 (ref 1.005–1.030)

## 2015-02-23 MED ORDER — CYCLOBENZAPRINE HCL 5 MG PO TABS: 5.0000 mg | ORAL_TABLET | Freq: Every day | ORAL | Status: DC

## 2015-02-23 MED ORDER — NAPROXEN 500 MG PO TABS: 500.0000 mg | ORAL_TABLET | Freq: Two times a day (BID) | ORAL | Status: DC

## 2015-02-23 NOTE — ED Notes (Signed)
Patient c/o left sided lower back pain for the past 3 days.  Patient denies fevers.  Patient denies N/V.

## 2015-02-23 NOTE — ED Provider Notes (Signed)
CSN: [id number]     Arrival date & time 02/23/15  1234 History   First MD Initiated Contact with Patient 02/23/15 1401     Chief Complaint  Patient presents with  . Back Pain   (Consider location/radiation/quality/duration/timing/severity/associated sxs/prior Treatment) HPI: Patient presents today with symptoms of left lower back pain. Patient states that she has had the symptoms for the last 3 days. She denies any injury or trauma to the area. She denies any fever. She denies any radicular symptoms. She has noticed some urinary frequency but no dysuria. She denies any known chronic back issues. Any position causes a symptoms to occur.  History reviewed. No pertinent past medical history. History reviewed. No pertinent past surgical history. History reviewed. No pertinent family history. Social History  Substance Use Topics  . Smoking status: Current Every Day Smoker -- 0.50 packs/day    Types: Cigarettes  . Smokeless tobacco: Never Used  . Alcohol Use: No   OB History    No data available     Review of Systems: Negative except mentioned above.   Allergies  Biaxin; Codeine; Levaquin; and Sulfa antibiotics  Home Medications   Prior to Admission medications   Medication Sig Start Date End Date Taking? Authorizing Provider  chlorpheniramine-HYDROcodone (TUSSIONEX PENNKINETIC ER) 10-8 MG/5ML SUER Take 5 mLs by mouth 2 (two) times daily. 01/06/15   Lutricia Feil, PA-C  fluticasone (FLONASE) 50 MCG/ACT nasal spray Place 2 sprays into both nostrils daily. 01/06/15   Lutricia Feil, PA-C   Meds Ordered and Administered this Visit  Medications - No data to display  BP 139/82 mmHg  Pulse 73  Temp(Src) 98 F (36.7 C) (Tympanic)  Resp 16  Ht 5' 7.5" (1.715 m)  Wt 225 lb (102.059 kg)  BMI 34.70 kg/m2  SpO2 100%  LMP 02/23/2015 (Exact Date) No data found.   Physical Exam   GENERAL: NAD HEENT: no pharyngeal erythema, no exudate, no erythema of TMs, no cervical LAD RESP:  CTA B CARD: RRR MSK: no midline tenderness, generalized tenderness in the left SI region, mild discomfort with flexion, negative SLR, 5/5 strength of LEs, nv intact  NEURO: CN II-XII grossly intact   ED Course  Procedures (including critical care time)  Labs Review Labs Reviewed  URINALYSIS COMPLETEWITH MICROSCOPIC [facility] ONLY)    Imaging Review No results found.    MDM  A/P: Left lower back pain/SI pain- we'll treat patient with Naprosyn and Flexeril, patient is currently spotting with her menstrual cycle which may be why she has blood in her urine. I've asked that she follow up with her primary care physician in have a UA done to make sure that this has resolved when she is not on her  menstrual cycle.  Ice/heat prn. If symptoms do persist or worsen I do recommend imaging of the area. Patient addresses understanding.   Jolene Provost, MD 02/23/15 [phone]

## 2015-02-25 LAB — URINE CULTURE: Special Requests: NORMAL

## 2015-04-10 ENCOUNTER — Ambulatory Visit
Admission: EM | Admit: 2015-04-10 | Discharge: 2015-04-10 | Disposition: A | Discharge status: Home / Self Care | Payer: Medicare Other | Attending: Emergency Medicine | Admitting: Emergency Medicine

## 2015-04-10 ENCOUNTER — Encounter: Payer: Self-pay | Admitting: Emergency Medicine

## 2015-04-10 DIAGNOSIS — F17210 Nicotine dependence, cigarettes, uncomplicated: Secondary | ICD-10-CM | POA: Diagnosis not present

## 2015-04-10 DIAGNOSIS — J069 Acute upper respiratory infection, unspecified: Secondary | ICD-10-CM | POA: Insufficient documentation

## 2015-04-10 DIAGNOSIS — R509 Fever, unspecified: Secondary | ICD-10-CM | POA: Diagnosis present

## 2015-04-10 DIAGNOSIS — R05 Cough: Secondary | ICD-10-CM | POA: Diagnosis present

## 2015-04-10 LAB — RAPID INFLUENZA A&B ANTIGENS (ARMC ONLY)
INFLUENZA A (ARMC): NEGATIVE
INFLUENZA B (ARMC): NEGATIVE

## 2015-04-10 MED ORDER — BENZONATATE 100 MG PO CAPS: 200.0000 mg | ORAL_CAPSULE | Freq: Three times a day (TID) | ORAL | Status: DC

## 2015-04-10 MED ORDER — ACETAMINOPHEN 325 MG PO TABS
650.0000 mg | ORAL_TABLET | Freq: Once | ORAL | Status: AC
  Administered 2015-04-10: 650 mg via ORAL

## 2015-04-10 MED ORDER — HYDROCOD POLST-CPM POLST ER 10-8 MG/5ML PO SUER: 5.0000 mL | Freq: Two times a day (BID) | ORAL | Status: DC

## 2015-04-10 NOTE — ED Provider Notes (Signed)
CSN: [id number]     Arrival date & time 04/10/15  6962 History   First MD Initiated Contact with Patient 04/10/15 1148     Chief Complaint  Patient presents with  . Cough  . Generalized Body Aches  . Fever   (Consider location/radiation/quality/duration/timing/severity/associated sxs/prior Treatment) HPI  This 55 year old female who presents with cough body aches and fever started 2 days ago. Did not take her temperature last night but had chills and hot episodes alternating. Today her fever is 99.3 pulse 89 respirations 16 blood pressure 149/78 and O2 sats 99% on room air. She does have a history of chronic bronchitis and sinusitis. She continues to smoke 1 pack cigarettes per day     History reviewed. No pertinent past medical history. History reviewed. No pertinent past surgical history. History reviewed. No pertinent family history. Social History  Substance Use Topics  . Smoking status: Current Every Day Smoker -- 0.50 packs/day    Types: Cigarettes  . Smokeless tobacco: Never Used  . Alcohol Use: No   OB History    No data available     Review of Systems  Constitutional: Positive for fever, chills, activity change and fatigue.  HENT: Positive for postnasal drip, rhinorrhea, sinus pressure and sore throat.   Respiratory: Positive for cough. Negative for shortness of breath, wheezing and stridor.   All other systems reviewed and are negative.   Allergies  Biaxin; Codeine; Levaquin; and Sulfa antibiotics  Home Medications   Prior to Admission medications   Medication Sig Start Date End Date Taking? Authorizing Provider  albuterol (PROVENTIL HFA;VENTOLIN HFA) 108 (90 Base) MCG/ACT inhaler Inhale 2 puffs into the lungs every 6 (six) hours as needed for wheezing or shortness of breath.   Yes Historical Provider, MD  benzonatate (TESSALON) 100 MG capsule Take 2 capsules (200 mg total) by mouth every 8 (eight) hours. 04/10/15   Lutricia Feil, PA-C   chlorpheniramine-HYDROcodone (TUSSIONEX PENNKINETIC ER) 10-8 MG/5ML SUER Take 5 mLs by mouth 2 (two) times daily. 04/10/15   Lutricia Feil, PA-C  cyclobenzaprine (FLEXERIL) 5 MG tablet Take 1 tablet (5 mg total) by mouth at bedtime. Prn muscle spasm. Can cause drowsiness. 02/23/15   Jolene Provost, MD  fluticasone (FLONASE) 50 MCG/ACT nasal spray Place 2 sprays into both nostrils daily. 01/06/15   Lutricia Feil, PA-C  naproxen (NAPROSYN) 500 MG tablet Take 1 tablet (500 mg total) by mouth 2 (two) times daily. 02/23/15   Jolene Provost, MD   Meds Ordered and Administered this Visit   Medications  acetaminophen (TYLENOL) tablet 650 mg (650 mg Oral Given 04/10/15 1121)    BP 149/78 mmHg  Pulse 89  Temp(Src) 99.3 F (37.4 C) (Tympanic)  Resp 16  Ht  (1.702 m)  Wt 220 lb (99.791 kg)  BMI 34.45 kg/m2  SpO2 99%  LMP 03/20/2015 (Approximate) No data found.   Physical Exam  Constitutional: She is oriented to person, place, and time. She appears well-developed and well-nourished. No distress.  HENT:  Head: Normocephalic and atraumatic.  Right Ear: External ear normal.  Left Ear: External ear normal.  Nose: Nose normal.  Mouth/Throat: Oropharynx is clear and moist. No oropharyngeal exudate.  Eyes: Conjunctivae are normal. Pupils are equal, round, and reactive to light.  Neck: Normal range of motion. Neck supple.  Pulmonary/Chest: Effort normal and breath sounds normal. No respiratory distress. She has no wheezes. She has no rales.  Musculoskeletal: Normal range of motion.  Lymphadenopathy:    She  has no cervical adenopathy.  Neurological: She is alert and oriented to person, place, and time.  Skin: Skin is warm and dry. She is not diaphoretic.  Psychiatric: She has a normal mood and affect. Her behavior is normal. Judgment and thought content normal.  Nursing note and vitals reviewed.   ED Course  Procedures (including critical care time)  Labs Review Labs Reviewed  RAPID  INFLUENZA A&B ANTIGENS [facility] ONLY)    Imaging Review No results found.   Visual Acuity Review  Right Eye Distance:   Left Eye Distance:   Bilateral Distance:    Right Eye Near:   Left Eye Near:    Bilateral Near:         MDM   1. Acute URI    Discharge Medication List as of 04/10/2015 12:06 PM    START taking these medications   Details  benzonatate (TESSALON) 100 MG capsule Take 2 capsules (200 mg total) by mouth every 8 (eight) hours., Starting 04/10/2015, Until Discontinued, Normal      Plan: 1. Test/x-ray results and diagnosis reviewed with patient 2. rx as per orders; risks, benefits, potential side effects reviewed with patient 3. Recommend supportive treatment with Rest and fluids as necessary. Advised to use Tylenol or Motrin for body aches and fever. Will prescribe Tussionex for her cough suppression. She does have an allergy to codeine in pill form for pain use but has taken Tussionex in the past without any problems. 4. F/u prn if symptoms worsen or don't improve     Lutricia FeilWilliam P Joenathan Sakuma, PA-C 04/10/15 1213

## 2015-04-10 NOTE — Discharge Instructions (Signed)
Cool Mist Vaporizers °Vaporizers may help relieve the symptoms of a cough and cold. They add moisture to the air, which helps mucus to become thinner and less sticky. This makes it easier to breathe and cough up secretions. Cool mist vaporizers do not cause serious burns like hot mist vaporizers, which may also be called steamers or humidifiers. Vaporizers have not been proven to help with colds. You should not use a vaporizer if you are allergic to mold. °HOME CARE INSTRUCTIONS °· Follow the package instructions for the vaporizer. °· Do not use anything other than distilled water in the vaporizer. °· Do not run the vaporizer all of the time. This can cause mold or bacteria to grow in the vaporizer. °· Clean the vaporizer after each time it is used. °· Clean and dry the vaporizer well before storing it. °· Stop using the vaporizer if worsening respiratory symptoms develop. °  °This information is not intended to replace advice given to you by your health care provider. Make sure you discuss any questions you have with your health care provider. °  °Document Released: 09/28/2003 Document Revised: 01/05/2013 Document Reviewed: 05/20/2012 °Elsevier Interactive Patient Education ©2016 [employer] ° °Upper Respiratory Infection, Adult °Most upper respiratory infections (URIs) are a viral infection of the air passages leading to the lungs. A URI affects the nose, throat, and upper air passages. The most common type of URI is nasopharyngitis and is typically referred to as "the common cold." °URIs run their course and usually go away on their own. Most of the time, a URI does not require medical attention, but sometimes a bacterial infection in the upper airways can follow a viral infection. This is called a secondary infection. Sinus and middle ear infections are common types of secondary upper respiratory infections. °Bacterial pneumonia can also complicate a URI. A URI can worsen asthma and chronic obstructive  pulmonary disease (COPD). Sometimes, these complications can require emergency medical care and may be life threatening.  °CAUSES °Almost all URIs are caused by viruses. A virus is a type of germ and can spread from one person to another.  °RISKS FACTORS °You may be at risk for a URI if:  °· You smoke.   °· You have chronic heart or lung disease. °· You have a weakened defense (immune) system.   °· You are very young or very old.   °· You have nasal allergies or asthma. °· You work in crowded or poorly ventilated areas. °· You work in health care facilities or schools. °SIGNS AND SYMPTOMS  °Symptoms typically develop 2-3 days after you come in contact with a cold virus. Most viral URIs last 7-10 days. However, viral URIs from the influenza virus (flu virus) can last 14-18 days and are typically more severe. Symptoms may include:  °· Runny or stuffy (congested) nose.   °· Sneezing.   °· Cough.   °· Sore throat.   °· Headache.   °· Fatigue.   °· Fever.   °· Loss of appetite.   °· Pain in your forehead, behind your eyes, and over your cheekbones (sinus pain). °· Muscle aches.   °DIAGNOSIS  °Your health care provider may diagnose a URI by: °· Physical exam. °· Tests to check that your symptoms are not due to another condition such as: °¨ Strep throat. °¨ Sinusitis. °¨ Pneumonia. °¨ Asthma. °TREATMENT  °A URI goes away on its own with time. It cannot be cured with medicines, but medicines may be prescribed or recommended to relieve symptoms. Medicines may help: °· Reduce your fever. °· Reduce   your cough. °· Relieve nasal congestion. °HOME CARE INSTRUCTIONS  °· Take medicines only as directed by your health care provider.   °· Gargle warm saltwater or take cough drops to comfort your throat as directed by your health care provider. °· Use a warm mist humidifier or inhale steam from a shower to increase air moisture. This may make it easier to breathe. °· Drink enough fluid to keep your urine clear or pale yellow.   °· Eat  soups and other clear broths and maintain good nutrition.   °· Rest as needed.   °· Return to work when your temperature has returned to normal or as your health care provider advises. You may need to stay home longer to avoid infecting others. You can also use a face mask and careful hand washing to prevent spread of the virus. °· Increase the usage of your inhaler if you have asthma.   °· Do not use any tobacco products, including cigarettes, chewing tobacco, or electronic cigarettes. If you need help quitting, ask your health care provider. °PREVENTION  °The best way to protect yourself from getting a cold is to practice good hygiene.  °· Avoid oral or hand contact with people with cold symptoms.   °· Wash your hands often if contact occurs.   °There is no clear evidence that vitamin C, vitamin E, echinacea, or exercise reduces the chance of developing a cold. However, it is always recommended to get plenty of rest, exercise, and practice good nutrition.  °SEEK MEDICAL CARE IF:  °· You are getting worse rather than better.   °· Your symptoms are not controlled by medicine.   °· You have chills. °· You have worsening shortness of breath. °· You have brown or red mucus. °· You have yellow or brown nasal discharge. °· You have pain in your face, especially when you bend forward. °· You have a fever. °· You have swollen neck glands. °· You have pain while swallowing. °· You have white areas in the back of your throat. °SEEK IMMEDIATE MEDICAL CARE IF:  °· You have severe or persistent: °¨ Headache. °¨ Ear pain. °¨ Sinus pain. °¨ Chest pain. °· You have chronic lung disease and any of the following: °¨ Wheezing. °¨ Prolonged cough. °¨ Coughing up blood. °¨ A change in your usual mucus. °· You have a stiff neck. °· You have changes in your: °¨ Vision. °¨ Hearing. °¨ Thinking. °¨ Mood. °MAKE SURE YOU:  °· Understand these instructions. °· Will watch your condition. °· Will get help right away if you are not doing well or  get worse. °  °This information is not intended to replace advice given to you by your health care provider. Make sure you discuss any questions you have with your health care provider. °  °Document Released: 06/26/2000 Document Revised: 05/17/2014 Document Reviewed: 04/07/2013 °Elsevier Interactive Patient Education ©2016 Else[employer] Inc. ° °

## 2015-04-10 NOTE — ED Notes (Signed)
Patient c/o cough, bodyaches, and fever that started 2 days ago.   

## 2015-04-11 ENCOUNTER — Encounter: Payer: Self-pay | Admitting: *Deleted

## 2015-04-11 ENCOUNTER — Ambulatory Visit: Payer: Medicare Other

## 2015-04-11 ENCOUNTER — Ambulatory Visit
Admission: EM | Admit: 2015-04-11 | Discharge: 2015-04-11 | Disposition: A | Discharge status: Home / Self Care | Payer: Medicare Other | Attending: Family Medicine | Admitting: Family Medicine

## 2015-04-11 DIAGNOSIS — R109 Unspecified abdominal pain: Secondary | ICD-10-CM | POA: Diagnosis present

## 2015-04-11 DIAGNOSIS — J069 Acute upper respiratory infection, unspecified: Secondary | ICD-10-CM | POA: Insufficient documentation

## 2015-04-11 DIAGNOSIS — R509 Fever, unspecified: Secondary | ICD-10-CM | POA: Diagnosis not present

## 2015-04-11 DIAGNOSIS — F17210 Nicotine dependence, cigarettes, uncomplicated: Secondary | ICD-10-CM | POA: Diagnosis not present

## 2015-04-11 DIAGNOSIS — R0602 Shortness of breath: Secondary | ICD-10-CM | POA: Diagnosis not present

## 2015-04-11 DIAGNOSIS — R05 Cough: Secondary | ICD-10-CM | POA: Diagnosis not present

## 2015-04-11 MED ORDER — PREDNISONE 20 MG PO TABS: ORAL_TABLET | ORAL | Status: DC

## 2015-04-11 MED ORDER — PROMETHAZINE-DM 6.25-15 MG/5ML PO SYRP: 5.0000 mL | ORAL_SOLUTION | Freq: Four times a day (QID) | ORAL | Status: DC | PRN

## 2015-04-11 NOTE — ED Provider Notes (Signed)
CSN: [id number]     Arrival date & time 04/11/15  [id number] History   First MD Initiated Contact with Patient 04/11/15 1045     Chief Complaint  Patient presents with  . Cough  . Pleurisy  . Back Pain  . Abdominal Pain   (Consider location/radiation/quality/duration/timing/severity/associated sxs/prior Treatment) HPI  This a 55 year old female who was seen here yesterday returns with the same symptoms that have worsened. SHe still has the same nonproductive cough having chest pain and soreness with her coughing and movement. She states that she's had a history of multiple pneumonias in the past and is concerned that this may be what is happening now. She has the same temperature that she had yesterday 99.3. Her O2 sats on room air slightly deep decreased at 97%. She states that she was not able to afford the Tussionex and is only been taken the [employer] which have not been beneficial.      History reviewed. No pertinent past medical history. History reviewed. No pertinent past surgical history. History reviewed. No pertinent family history. Social History  Substance Use Topics  . Smoking status: Current Every Day Smoker -- 0.50 packs/day    Types: Cigarettes  . Smokeless tobacco: Never Used  . Alcohol Use: No   OB History    No data available     Review of Systems  Constitutional: Positive for fever, chills, activity change and fatigue.  Respiratory: Positive for cough and shortness of breath.   All other systems reviewed and are negative.   Allergies  Biaxin; Codeine; Levaquin; and Sulfa antibiotics  Home Medications   Prior to Admission medications   Medication Sig Start Date End Date Taking? Authorizing Provider  albuterol (PROVENTIL HFA;VENTOLIN HFA) 108 (90 Base) MCG/ACT inhaler Inhale 2 puffs into the lungs every 6 (six) hours as needed for wheezing or shortness of breath.   Yes Historical Provider, MD  benzonatate (TESSALON) 100 MG capsule Take 2 capsules (200  mg total) by mouth every 8 (eight) hours. 04/10/15  Yes Lutricia FeilWilliam P Roemer, PA-C  fluticasone (FLONASE) 50 MCG/ACT nasal spray Place 2 sprays into both nostrils daily. 01/06/15  Yes Lutricia FeilWilliam P Roemer, PA-C  chlorpheniramine-HYDROcodone (TUSSIONEX PENNKINETIC ER) 10-8 MG/5ML SUER Take 5 mLs by mouth 2 (two) times daily. 04/10/15   Lutricia FeilWilliam P Roemer, PA-C  cyclobenzaprine (FLEXERIL) 5 MG tablet Take 1 tablet (5 mg total) by mouth at bedtime. Prn muscle spasm. Can cause drowsiness. 02/23/15   Jolene ProvostKirtida Patel, MD  naproxen (NAPROSYN) 500 MG tablet Take 1 tablet (500 mg total) by mouth 2 (two) times daily. 02/23/15   Jolene ProvostKirtida Patel, MD  predniSONE (DELTASONE) 20 MG tablet Take 2 tablets (40 mg) daily by mouth 04/11/15   Lutricia FeilWilliam P Roemer, PA-C  promethazine-dextromethorphan (PROMETHAZINE-DM) 6.25-15 MG/5ML syrup Take 5 mLs by mouth 4 (four) times daily as needed for cough. 04/11/15   Lutricia FeilWilliam P Roemer, PA-C   Meds Ordered and Administered this Visit  Medications - No data to display  BP 134/85 mmHg  Pulse 88  Temp(Src) 99.3 F (37.4 C) (Oral)  Resp 16  Ht 5\' 8"  (1.727 m)  Wt 220 lb (99.791 kg)  BMI 33.46 kg/m2  SpO2 97%  LMP 03/20/2015 (Approximate) No data found.   Physical Exam  Constitutional: She is oriented to person, place, and time. She appears well-developed and well-nourished. No distress.  HENT:  Head: Normocephalic and atraumatic.  Right Ear: External ear normal.  Left Ear: External ear normal.  Nose: Nose normal.  Mouth/Throat:  Oropharynx is clear and moist.  Eyes: Conjunctivae are normal. Pupils are equal, round, and reactive to light. Right eye exhibits no discharge. Left eye exhibits no discharge.  Neck: Normal range of motion. Neck supple.  Pulmonary/Chest: Effort normal and breath sounds normal. No respiratory distress. She has no wheezes. She has no rales.  Musculoskeletal: Normal range of motion. She exhibits no edema or tenderness.  Lymphadenopathy:    She has no cervical  adenopathy.  Neurological: She is alert and oriented to person, place, and time.  Skin: Skin is warm and dry. She is not diaphoretic.  Psychiatric: She has a normal mood and affect. Her behavior is normal. Judgment and thought content normal.  Nursing note and vitals reviewed.   ED Course  Procedures (including critical care time)  Labs Review Labs Reviewed - No data to display  Imaging Review Dg Chest 2 View  04/11/2015  CLINICAL DATA:  Cough and fever ; shortness of breath with exertion EXAM: CHEST  2 VIEW COMPARISON:  July 09, 2014 FINDINGS: There is no edema or consolidation. The heart size and pulmonary vascularity are normal. No adenopathy. No bone lesions. IMPRESSION: No edema or consolidation. Electronically Signed   By: Bretta Bang III M.D.   On: 04/11/2015 11:19     Visual Acuity Review  Right Eye Distance:   Left Eye Distance:   Bilateral Distance:    Right Eye Near:   Left Eye Near:    Bilateral Near:         MDM   1. Acute URI    Discharge Medication List as of 04/11/2015 12:11 PM    START taking these medications   Details  predniSONE (DELTASONE) 20 MG tablet Take 2 tablets (40 mg) daily by mouth, Normal    promethazine-dextromethorphan (PROMETHAZINE-DM) 6.25-15 MG/5ML syrup Take 5 mLs by mouth 4 (four) times daily as needed for cough., Starting 04/11/2015, Until Discontinued, Normal      Plan: 1. Test/x-ray results and diagnosis reviewed with patient 2. rx as per orders; risks, benefits, potential side effects reviewed with patient 3. Recommend supportive treatment with The same instructions as her last discharge. She is allergic to codeine cough syrup will not be appropriate for her. So I decided to give her a short dose of prednisone to help with her coughing. I've also told her that this is set to run its course and may take some time. She is not improving she should follow-up with her primary care physician or go to the emergency department. 4.  F/u prn if symptoms worsen or don't improve     Lutricia Feil, PA-C 04/11/15 2132

## 2015-04-11 NOTE — ED Notes (Signed)
Seen here yesterday. Here today with same symptoms but worse. C/o non-productive cough, chest pain/soreness with coughing and movement, also back and abd pain/soreness from cough. States hx of multiple pneumonias.

## 2015-04-11 NOTE — Discharge Instructions (Signed)
Cool Mist Vaporizers °Vaporizers may help relieve the symptoms of a cough and cold. They add moisture to the air, which helps mucus to become thinner and less sticky. This makes it easier to breathe and cough up secretions. Cool mist vaporizers do not cause serious burns like hot mist vaporizers, which may also be called steamers or humidifiers. Vaporizers have not been proven to help with colds. You should not use a vaporizer if you are allergic to mold. °HOME CARE INSTRUCTIONS °· Follow the package instructions for the vaporizer. °· Do not use anything other than distilled water in the vaporizer. °· Do not run the vaporizer all of the time. This can cause mold or bacteria to grow in the vaporizer. °· Clean the vaporizer after each time it is used. °· Clean and dry the vaporizer well before storing it. °· Stop using the vaporizer if worsening respiratory symptoms develop. °  °This information is not intended to replace advice given to you by your health care provider. Make sure you discuss any questions you have with your health care provider. °  °Document Released: 09/28/2003 Document Revised: 01/05/2013 Document Reviewed: 05/20/2012 °Elsevier Interactive Patient Education ©2016 [employer] ° °Upper Respiratory Infection, Adult °Most upper respiratory infections (URIs) are a viral infection of the air passages leading to the lungs. A URI affects the nose, throat, and upper air passages. The most common type of URI is nasopharyngitis and is typically referred to as "the common cold." °URIs run their course and usually go away on their own. Most of the time, a URI does not require medical attention, but sometimes a bacterial infection in the upper airways can follow a viral infection. This is called a secondary infection. Sinus and middle ear infections are common types of secondary upper respiratory infections. °Bacterial pneumonia can also complicate a URI. A URI can worsen asthma and chronic obstructive  pulmonary disease (COPD). Sometimes, these complications can require emergency medical care and may be life threatening.  °CAUSES °Almost all URIs are caused by viruses. A virus is a type of germ and can spread from one person to another.  °RISKS FACTORS °You may be at risk for a URI if:  °· You smoke.   °· You have chronic heart or lung disease. °· You have a weakened defense (immune) system.   °· You are very young or very old.   °· You have nasal allergies or asthma. °· You work in crowded or poorly ventilated areas. °· You work in health care facilities or schools. °SIGNS AND SYMPTOMS  °Symptoms typically develop 2-3 days after you come in contact with a cold virus. Most viral URIs last 7-10 days. However, viral URIs from the influenza virus (flu virus) can last 14-18 days and are typically more severe. Symptoms may include:  °· Runny or stuffy (congested) nose.   °· Sneezing.   °· Cough.   °· Sore throat.   °· Headache.   °· Fatigue.   °· Fever.   °· Loss of appetite.   °· Pain in your forehead, behind your eyes, and over your cheekbones (sinus pain). °· Muscle aches.   °DIAGNOSIS  °Your health care provider may diagnose a URI by: °· Physical exam. °· Tests to check that your symptoms are not due to another condition such as: °¨ Strep throat. °¨ Sinusitis. °¨ Pneumonia. °¨ Asthma. °TREATMENT  °A URI goes away on its own with time. It cannot be cured with medicines, but medicines may be prescribed or recommended to relieve symptoms. Medicines may help: °· Reduce your fever. °· Reduce   your cough. °· Relieve nasal congestion. °HOME CARE INSTRUCTIONS  °· Take medicines only as directed by your health care provider.   °· Gargle warm saltwater or take cough drops to comfort your throat as directed by your health care provider. °· Use a warm mist humidifier or inhale steam from a shower to increase air moisture. This may make it easier to breathe. °· Drink enough fluid to keep your urine clear or pale yellow.   °· Eat  soups and other clear broths and maintain good nutrition.   °· Rest as needed.   °· Return to work when your temperature has returned to normal or as your health care provider advises. You may need to stay home longer to avoid infecting others. You can also use a face mask and careful hand washing to prevent spread of the virus. °· Increase the usage of your inhaler if you have asthma.   °· Do not use any tobacco products, including cigarettes, chewing tobacco, or electronic cigarettes. If you need help quitting, ask your health care provider. °PREVENTION  °The best way to protect yourself from getting a cold is to practice good hygiene.  °· Avoid oral or hand contact with people with cold symptoms.   °· Wash your hands often if contact occurs.   °There is no clear evidence that vitamin C, vitamin E, echinacea, or exercise reduces the chance of developing a cold. However, it is always recommended to get plenty of rest, exercise, and practice good nutrition.  °SEEK MEDICAL CARE IF:  °· You are getting worse rather than better.   °· Your symptoms are not controlled by medicine.   °· You have chills. °· You have worsening shortness of breath. °· You have brown or red mucus. °· You have yellow or brown nasal discharge. °· You have pain in your face, especially when you bend forward. °· You have a fever. °· You have swollen neck glands. °· You have pain while swallowing. °· You have white areas in the back of your throat. °SEEK IMMEDIATE MEDICAL CARE IF:  °· You have severe or persistent: °¨ Headache. °¨ Ear pain. °¨ Sinus pain. °¨ Chest pain. °· You have chronic lung disease and any of the following: °¨ Wheezing. °¨ Prolonged cough. °¨ Coughing up blood. °¨ A change in your usual mucus. °· You have a stiff neck. °· You have changes in your: °¨ Vision. °¨ Hearing. °¨ Thinking. °¨ Mood. °MAKE SURE YOU:  °· Understand these instructions. °· Will watch your condition. °· Will get help right away if you are not doing well or  get worse. °  °This information is not intended to replace advice given to you by your health care provider. Make sure you discuss any questions you have with your health care provider. °  °Document Released: 06/26/2000 Document Revised: 05/17/2014 Document Reviewed: 04/07/2013 °Elsevier Interactive Patient Education ©2016 Else[employer] Inc. ° °

## 2015-04-12 ENCOUNTER — Encounter: Payer: Self-pay | Admitting: *Deleted

## 2015-04-12 ENCOUNTER — Emergency Department
Admission: EM | Admit: 2015-04-12 | Discharge: 2015-04-12 | Disposition: A | Discharge status: Home / Self Care | Payer: Medicare Other | Attending: Emergency Medicine | Admitting: Emergency Medicine

## 2015-04-12 DIAGNOSIS — Z881 Allergy status to other antibiotic agents status: Secondary | ICD-10-CM | POA: Insufficient documentation

## 2015-04-12 DIAGNOSIS — R05 Cough: Secondary | ICD-10-CM | POA: Insufficient documentation

## 2015-04-12 DIAGNOSIS — F17210 Nicotine dependence, cigarettes, uncomplicated: Secondary | ICD-10-CM | POA: Insufficient documentation

## 2015-04-12 DIAGNOSIS — Z885 Allergy status to narcotic agent status: Secondary | ICD-10-CM | POA: Diagnosis not present

## 2015-04-12 DIAGNOSIS — R059 Cough, unspecified: Secondary | ICD-10-CM

## 2015-04-12 MED ORDER — DOXYCYCLINE HYCLATE 100 MG PO TABS: 100.0000 mg | ORAL_TABLET | Freq: Two times a day (BID) | ORAL | Status: DC

## 2015-04-12 MED ORDER — ALBUTEROL SULFATE HFA 108 (90 BASE) MCG/ACT IN AERS: 2.0000 | INHALATION_SPRAY | RESPIRATORY_TRACT | Status: DC | PRN

## 2015-04-12 MED ORDER — BENZONATATE 200 MG PO CAPS
200.0000 mg | ORAL_CAPSULE | Freq: Three times a day (TID) | ORAL | Status: DC | PRN
Start: 2015-04-12 — End: 2016-01-10

## 2015-04-12 NOTE — Discharge Instructions (Signed)
Cough, Adult Coughing is a reflex that clears your throat and your airways. Coughing helps to heal and protect your lungs. It is normal to cough occasionally, but a cough that happens with other symptoms or lasts a long time may be a sign of a condition that needs treatment. A cough may last only 2-3 weeks (acute), or it may last longer than 8 weeks (chronic). CAUSES Coughing is commonly caused by:  Breathing in substances that irritate your lungs.  A viral or bacterial respiratory infection.  Allergies.  Asthma.  Postnasal drip.  Smoking.  Acid backing up from the stomach into the esophagus (gastroesophageal reflux).  Certain medicines.  Chronic lung problems, including COPD (or rarely, lung cancer).  Other medical conditions such as heart failure. HOME CARE INSTRUCTIONS  Pay attention to any changes in your symptoms. Take these actions to help with your discomfort:  Take medicines only as told by your health care provider.  If you were prescribed an antibiotic medicine, take it as told by your health care provider. Do not stop taking the antibiotic even if you start to feel better.  Talk with your health care provider before you take a cough suppressant medicine.  Drink enough fluid to keep your urine clear or pale yellow.  If the air is dry, use a cold steam vaporizer or humidifier in your bedroom or your home to help loosen secretions.  Avoid anything that causes you to cough at work or at home.  If your cough is worse at night, try sleeping in a semi-upright position.  Avoid cigarette smoke. If you smoke, quit smoking. If you need help quitting, ask your health care provider.  Avoid caffeine.  Avoid alcohol.  Rest as needed. SEEK MEDICAL CARE IF:   You have new symptoms.  You cough up pus.  Your cough does not get better after 2-3 weeks, or your cough gets worse.  You cannot control your cough with suppressant medicines and you are losing sleep.  You  develop pain that is getting worse or pain that is not controlled with pain medicines.  You have a fever.  You have unexplained weight loss.  You have night sweats. SEEK IMMEDIATE MEDICAL CARE IF:  You cough up blood.  You have difficulty breathing.  Your heartbeat is very fast.   This information is not intended to replace advice given to you by your health care provider. Make sure you discuss any questions you have with your health care provider.   Upper Respiratory Infection, Adult  Most upper respiratory infections (URIs) are a viral infection of the air passages leading to the lungs. A URI affects the nose, throat, and upper air passages. The most common type of URI is nasopharyngitis and is typically referred to as "the common cold."  URIs run their course and usually go away on their own. Most of the time, a URI does not require medical attention, but sometimes a bacterial infection in the upper airways can follow a viral infection. This is called a secondary infection. Sinus and middle ear infections are common types of secondary upper respiratory infections.    Bacterial pneumonia can also complicate a URI. A URI can worsen asthma and chronic obstructive pulmonary disease (COPD). Sometimes, these complications can require emergency medical care and may be life threatening.  CAUSES  Almost all URIs are caused by viruses. A virus is a type of germ and can spread from one person to another.  RISKS FACTORS  You may be at  risk for a URI if:  You smoke.  You have chronic heart or lung disease.  You have a weakened defense (immune) system.  You are very young or very old.  You have nasal allergies or asthma.  You work in crowded or poorly ventilated areas.  You work in health care facilities or schools. SIGNS AND SYMPTOMS  Symptoms typically develop 2-3 days after you come in contact with a cold virus. Most viral URIs last 7-10 days. However, viral URIs from the influenza virus  (flu virus) can last 14-18 days and are typically more severe. Symptoms may include:  Runny or stuffy (congested) nose.  Sneezing.  Cough.  Sore throat.  Headache.  Fatigue.  Fever.  Loss of appetite.  Pain in your forehead, behind your eyes, and over your cheekbones (sinus pain).  Muscle aches.  DIAGNOSIS  Your health care provider may diagnose a URI by:  Physical exam.  Tests to check that your symptoms are not due to another condition such as:  Strep throat.  Sinusitis.  Pneumonia.  Asthma. TREATMENT  A URI goes away on its own with time. It cannot be cured with medicines, but medicines may be prescribed or recommended to relieve symptoms. Medicines may help:  Reduce your fever.  Reduce your cough.  Relieve nasal congestion. HOME CARE INSTRUCTIONS  Take medicines only as directed by your health care provider.  Gargle warm saltwater or take cough drops to comfort your throat as directed by your health care provider.  Use a warm mist humidifier or inhale steam from a shower to increase air moisture. This may make it easier to breathe.  Drink enough fluid to keep your urine clear or pale yellow.  Eat soups and other clear broths and maintain good nutrition.  Rest as needed.  Return to work when your temperature has returned to normal or as your health care provider advises. You may need to stay home longer to avoid infecting others. You can also use a face mask and careful hand washing to prevent spread of the virus.  Increase the usage of your inhaler if you have asthma.  Do not use any tobacco products, including cigarettes, chewing tobacco, or electronic cigarettes. If you need help quitting, ask your health care provider. PREVENTION  The best way to protect yourself from getting a cold is to practice good hygiene.  Avoid oral or hand contact with people with cold symptoms.  Wash your hands often if contact occurs.  There is no clear evidence that vitamin C, vitamin E,  echinacea, or exercise reduces the chance of developing a cold. However, it is always recommended to get plenty of rest, exercise, and practice good nutrition.  SEEK MEDICAL CARE IF:  You are getting worse rather than better.  Your symptoms are not controlled by medicine.  You have chills.  You have worsening shortness of breath.  You have brown or red mucus.  You have yellow or brown nasal discharge.  You have pain in your face, especially when you bend forward.  You have a fever.  You have swollen neck glands.  You have pain while swallowing.  You have white areas in the back of your throat. SEEK IMMEDIATE MEDICAL CARE IF:  You have severe or persistent:  Headache.  Ear pain.  Sinus pain.  Chest pain. You have chronic lung disease and any of the following:  Wheezing.  Prolonged cough.  Coughing up blood.  A change in your usual mucus. You have a stiff  neck.  You have changes in your:  Vision.  Hearing.  Thinking.  Mood. MAKE SURE YOU:  Understand these instructions.  Will watch your condition.  Will get help right away if you are not doing well or get worse. This information is not intended to replace advice given to you by your health care provider. Make sure you discuss any questions you have with your health care provider.  Document Released: 06/26/2000 Document Revised: 05/17/2014 Document Reviewed: 04/07/2013  Elsevier Interactive Patient Education 2016 [employer]     Document Released: 06/29/2010 Document Revised: 09/21/2014 Document Reviewed: 03/09/2014 Elsevier Interactive Patient Education [employer]

## 2015-04-12 NOTE — ED Notes (Signed)
Pt complains of cough, congestion and fever for 5 days

## 2015-04-12 NOTE — ED Notes (Signed)
Pt states has been seen at urgent care twice. Pt given prescriptions for promethazine syrup and prednisone. Pt states these make her feel cold inside. Pt states she wants an antibiotic.

## 2015-04-12 NOTE — ED Provider Notes (Signed)
[facility] Emergency Department Provider Note  ____________________________________________  Time seen: Approximately 7:15 PM  I have reviewed the triage vital signs and the nursing notes.   HISTORY  Chief Complaint Cough    HPI Karen Reed is a 55 y.o. female who presents emergency department for a cough. Patient has been seen at an urgent care twice prior and was diagnosed with viral upper respiratory infection. Patient was given cough syrup and prednisone. Patient states that the cough syrup has made her feel "cold" inside. She has ceased to take cough medication. Patient had negative flu and strep test. She has also had a negative x-ray for any pneumonia. Patient denies any fevers or chills, neck pain, chest pain, abdominal pain, nausea or vomiting. Patient does endorse some mild shortness of breath during her coughing episodes. Patient is requesting antibiotics.   History reviewed. No pertinent past medical history.  There are no active problems to display for this patient.   History reviewed. No pertinent past surgical history.  Current Outpatient Rx  Name  Route  Sig  Dispense  Refill  . albuterol (PROVENTIL HFA;VENTOLIN HFA) 108 (90 Base) MCG/ACT inhaler   Inhalation   Inhale 2 puffs into the lungs every 4 (four) hours as needed for wheezing or shortness of breath.   1 Inhaler   0   . benzonatate (TESSALON) 200 MG capsule   Oral   Take 1 capsule (200 mg total) by mouth 3 (three) times daily as needed for cough.   21 capsule   0   . chlorpheniramine-HYDROcodone (TUSSIONEX PENNKINETIC ER) 10-8 MG/5ML SUER   Oral   Take 5 mLs by mouth 2 (two) times daily.   115 mL   0   . cyclobenzaprine (FLEXERIL) 5 MG tablet   Oral   Take 1 tablet (5 mg total) by mouth at bedtime. Prn muscle spasm. Can cause drowsiness.   12 tablet   0   . doxycycline (VIBRA-TABS) 100 MG tablet   Oral   Take 1 tablet (100 mg total) by mouth 2 (two) times  daily.   14 tablet   0   . fluticasone (FLONASE) 50 MCG/ACT nasal spray   Each Nare   Place 2 sprays into both nostrils daily.   16 g   0   . naproxen (NAPROSYN) 500 MG tablet   Oral   Take 1 tablet (500 mg total) by mouth 2 (two) times daily.   20 tablet   0   . predniSONE (DELTASONE) 20 MG tablet      Take 2 tablets (40 mg) daily by mouth   8 tablet   0   . promethazine-dextromethorphan (PROMETHAZINE-DM) 6.25-15 MG/5ML syrup   Oral   Take 5 mLs by mouth 4 (four) times daily as needed for cough.   118 mL   0     Allergies Biaxin; Codeine; Levaquin; and Sulfa antibiotics  No family history on file.  Social History Social History  Substance Use Topics  . Smoking status: Current Every Day Smoker -- 0.50 packs/day    Types: Cigarettes  . Smokeless tobacco: Never Used  . Alcohol Use: No     Review of Systems  Constitutional: No fever/chills Eyes: No visual changes. No discharge ENT: No sore throat. Denies nasal congestion. Cardiovascular: no chest pain. Respiratory: Positive cough. No SOB. Gastrointestinal: No abdominal pain.  No nausea, no vomiting.   Skin: Negative for rash. Neurological: Negative for headaches, focal weakness or numbness. 10-point ROS otherwise  negative.  ____________________________________________   PHYSICAL EXAM:  VITAL SIGNS: ED Triage Vitals  Enc Vitals Group     BP 04/12/15 1825 132/80 mmHg     Pulse Rate 04/12/15 1825 93     Resp 04/12/15 1825 18     Temp 04/12/15 1825 98.2 F (36.8 C)     Temp Source 04/12/15 1825 Oral     SpO2 04/12/15 1825 97 %     Weight 04/12/15 1825 220 lb (99.791 kg)     Height 04/12/15 1825  (1.727 m)     Head Cir --      Peak Flow --      Pain Score --      Pain Loc --      Pain Edu? --      Excl. in GC? --      Constitutional: Alert and oriented. Well appearing and in no acute distress. Eyes: Conjunctivae are normal. PERRL. EOMI. Head: Atraumatic. ENT:      Ears: EACs and TMs  are unremarkable bilaterally.      Nose: No congestion/rhinnorhea.      Mouth/Throat: Mucous membranes are moist. Oropharynx is nonerythematous and nonedematous. Neck: No stridor.   Hematological/Lymphatic/Immunilogical: No cervical lymphadenopathy. Cardiovascular: Normal rate, regular rhythm. Normal S1 and S2.  Good peripheral circulation. Respiratory: Normal respiratory effort without tachypnea or retractions. Lungs with faint expiratory wheezing in the bilateral lower lobes. No rales or rhonchi. No decreased or absent breath sounds. Gastrointestinal: Soft and nontender. No distention.  Neurologic:  Normal speech and language. No gross focal neurologic deficits are appreciated.  Skin:  Skin is warm, dry and intact. No rash noted. Psychiatric: Mood and affect are normal. Speech and behavior are normal. Patient exhibits appropriate insight and judgement.   ____________________________________________   LABS (all labs ordered are listed, but only abnormal results are displayed)  Labs Reviewed - No data to display ____________________________________________  EKG   ____________________________________________  RADIOLOGY   No results found.  ____________________________________________    PROCEDURES  Procedure(s) performed:       Medications - No data to display   ____________________________________________   INITIAL IMPRESSION / ASSESSMENT AND PLAN / ED COURSE  Pertinent labs & imaging results that were available during my care of the patient were reviewed by me and considered in my medical decision making (see chart for details).  Patient's diagnosis is consistent with a cough that is likely viral in nature. Patient is requesting antibiotics. At this time exam is reassuring with no acute abnormality. Patient is already received a negative flu, strep, and chest x-ray. At this time patient will be placed on antibiotics, albuterol, and instructed to continue her  prednisone..  Patient is to follow up with primary care provider if symptoms persist past this treatment course. Patient is given ED precautions to return to the ED for any worsening or new symptoms.     ____________________________________________  FINAL CLINICAL IMPRESSION(S) / ED DIAGNOSES  Final diagnoses:  Cough      NEW MEDICATIONS STARTED DURING THIS VISIT:  New Prescriptions   ALBUTEROL (PROVENTIL HFA;VENTOLIN HFA) 108 (90 BASE) MCG/ACT INHALER    Inhale 2 puffs into the lungs every 4 (four) hours as needed for wheezing or shortness of breath.   BENZONATATE (TESSALON) 200 MG CAPSULE    Take 1 capsule (200 mg total) by mouth 3 (three) times daily as needed for cough.   DOXYCYCLINE (VIBRA-TABS) 100 MG TABLET    Take 1 tablet (100 mg total) by  mouth 2 (two) times daily.        This chart was dictated using voice recognition software/Dragon. Despite best efforts to proofread, errors can occur which can change the meaning. Any change was purely unintentional.    Racheal PatchesJonathan D Cuthriell, PA-C 04/12/15 1922  Rockne MenghiniAnne-Caroline Norman, MD 04/12/15 2303

## 2016-01-01 DIAGNOSIS — T7029XA Other effects of high altitude, initial encounter: Secondary | ICD-10-CM | POA: Diagnosis not present

## 2016-01-10 ENCOUNTER — Ambulatory Visit: Payer: Medicare Other

## 2016-01-10 ENCOUNTER — Ambulatory Visit
Admission: EM | Admit: 2016-01-10 | Discharge: 2016-01-10 | Disposition: A | Discharge status: Home / Self Care | Payer: Medicare Other | Attending: Internal Medicine | Admitting: Internal Medicine

## 2016-01-10 ENCOUNTER — Encounter: Payer: Self-pay | Admitting: *Deleted

## 2016-01-10 DIAGNOSIS — Z881 Allergy status to other antibiotic agents status: Secondary | ICD-10-CM | POA: Diagnosis not present

## 2016-01-10 DIAGNOSIS — Z885 Allergy status to narcotic agent status: Secondary | ICD-10-CM | POA: Insufficient documentation

## 2016-01-10 DIAGNOSIS — G4725 Circadian rhythm sleep disorder, jet lag type: Secondary | ICD-10-CM

## 2016-01-10 DIAGNOSIS — F17210 Nicotine dependence, cigarettes, uncomplicated: Secondary | ICD-10-CM | POA: Diagnosis not present

## 2016-01-10 DIAGNOSIS — Z882 Allergy status to sulfonamides status: Secondary | ICD-10-CM | POA: Insufficient documentation

## 2016-01-10 DIAGNOSIS — B349 Viral infection, unspecified: Secondary | ICD-10-CM | POA: Diagnosis not present

## 2016-01-10 DIAGNOSIS — Z79899 Other long term (current) drug therapy: Secondary | ICD-10-CM | POA: Diagnosis not present

## 2016-01-10 DIAGNOSIS — M7989 Other specified soft tissue disorders: Secondary | ICD-10-CM | POA: Insufficient documentation

## 2016-01-10 DIAGNOSIS — J029 Acute pharyngitis, unspecified: Secondary | ICD-10-CM | POA: Diagnosis not present

## 2016-01-10 DIAGNOSIS — T7020XD Unspecified effects of high altitude, subsequent encounter: Secondary | ICD-10-CM

## 2016-01-10 DIAGNOSIS — T7029XD Other effects of high altitude, subsequent encounter: Secondary | ICD-10-CM | POA: Diagnosis not present

## 2016-01-10 DIAGNOSIS — T7020XA Unspecified effects of high altitude, initial encounter: Secondary | ICD-10-CM | POA: Diagnosis not present

## 2016-01-10 LAB — RAPID STREP SCREEN (MED CTR MEBANE ONLY): Streptococcus, Group A Screen (Direct): NEGATIVE

## 2016-01-10 MED ORDER — PREDNISONE 50 MG PO TABS: 50.0000 mg | ORAL_TABLET | Freq: Every day | ORAL | 0 refills | Status: DC

## 2016-01-10 MED ORDER — IBUPROFEN 600 MG PO TABS: 600.0000 mg | ORAL_TABLET | Freq: Three times a day (TID) | ORAL | 0 refills | Status: AC | PRN

## 2016-01-10 NOTE — ED Triage Notes (Signed)
[place] where she began having dyspnea, and lower extremity edema. Was seen by a physician there and dx with altitude sickness and given Decadron. Pt returned here Sunday and states she hasn't felt right since her trip. Now c/o dizziness, and states her throat "feels like it is closing up", dysphagia, sore throat. Also has had fever, chills, and body aches and her lower extremities continue with edema and "throb". Denies dyspnea.

## 2016-01-10 NOTE — ED Provider Notes (Signed)
[facility]    CSN: [id number] Arrival date & time: 01/10/16  1318     History   Chief Complaint Chief Complaint  Patient presents with  . Sore Throat  . Leg Swelling    HPI Karen Reed is a 55 y.o. female. Patient presents today for newly returned from a [place] where she was at 8000 feet altitude, and was diagnosed with acute altitude sickness. She is not able to take Diamox because of sulfa allergy, and was given a dose of Decadron which was somewhat helpful. She had a lot of dyspnea, leg swelling, and exercise intolerance [place]. She feels like the swelling and breathlessness are better since she returned, but now has malaise, fatigue, little bit of sensation of tightness and stickiness in her throat. Some urinary frequency. Nausea. Little bit of dry cough, little bit of sinus congestion. No diarrhea, no headache, no vision change. Not vomiting. No dysuria.  Lots of sick exposures while she was [place].   HPI  History reviewed. No pertinent past medical history.  History reviewed. No pertinent surgical history.     Home Medications    Prior to Admission medications   Medication Sig Start Date End Date Taking? Authorizing Provider  albuterol (PROVENTIL HFA;VENTOLIN HFA) 108 (90 Base) MCG/ACT inhaler Inhale 2 puffs into the lungs every 4 (four) hours as needed for wheezing or shortness of breath. 04/12/15  Yes Christiane HaJonathan D Cuthriell, PA-C  ibuprofen (ADVIL,MOTRIN) 600 MG tablet Take 1 tablet (600 mg total) by mouth 3 (three) times daily as needed. 01/10/16 01/25/16  Eustace MooreLaura W Asuna Peth, MD  predniSONE (DELTASONE) 50 MG tablet Take 1 tablet (50 mg total) by mouth daily. 01/10/16   Eustace MooreLaura W Nikolis Berent, MD    Family History History reviewed. No pertinent family history.  Social History Social History  Substance Use Topics  . Smoking status: Current Every Day Smoker    Packs/day: 0.50    Types: Cigarettes  . Smokeless tobacco: Never  Used  . Alcohol use Yes     Allergies   Biaxin [clarithromycin]; Codeine; Levaquin [levofloxacin]; and Sulfa antibiotics   Review of Systems Review of Systems  All other systems reviewed and are negative.    Physical Exam Triage Vital Signs ED Triage Vitals  Enc Vitals Group     BP 01/10/16 1529 (!) 152/89     Pulse Rate 01/10/16 1529 72     Resp 01/10/16 1529 16     Temp 01/10/16 1529 99 F (37.2 C)     Temp Source 01/10/16 1529 Oral     SpO2 01/10/16 1529 100 %     Weight 01/10/16 1531 225 lb (102.1 kg)     Height 01/10/16 1531 5\' 8"  (1.727 m)     Pain Score --    Updated Vital Signs BP (!) 152/89 (BP Location: Left Arm)   Pulse 72   Temp 99 F (37.2 C) (Oral)   Resp 16   Ht 5\' 8"  (1.727 m)   Wt 225 lb (102.1 kg)   LMP 01/03/2016 (Exact Date)   SpO2 100%   BMI 34.21 kg/m  Physical Exam  Constitutional: She is oriented to person, place, and time. No distress.  Alert, nicely groomed Looks tired  HENT:  Head: Atraumatic.  Bilateral TMs moderately dull, flushed pink Marked nasal congestion without nasal discharge Throat is red with prominent tonsils, no exudate  Eyes:  Conjugate gaze, no eye redness/drainage  Neck: Neck  supple.  Cardiovascular: Normal rate and regular rhythm.   Pulmonary/Chest: No respiratory distress. She has no wheezes. She has no rales.  Lungs clear, symmetric breath sounds  Abdominal: She exhibits no distension.  Musculoskeletal: Normal range of motion.  No leg swelling  Neurological: She is alert and oriented to person, place, and time.  Skin: Skin is warm and dry.  No cyanosis  Nursing note and vitals reviewed.    UC Treatments / Results  Labs Results for orders placed or performed during the hospital encounter of 01/10/16  Rapid strep screen  Result Value Ref Range   Streptococcus, Group A Screen (Direct) NEGATIVE NEGATIVE  Culture, group A strep  Result Value Ref Range   Specimen Description THROAT    Special Requests  NONE Reflexed from [id number]    Culture      CULTURE REINCUBATED FOR BETTER GROWTH [facility]    Report Status PENDING    ECG:  NSR, no acute ST or T wave changes.  No IVCD.    Radiology Dg Chest 2 View  Result Date: 01/10/2016 CLINICAL DATA:  Recent altitude sickness. EXAM: CHEST  2 VIEW COMPARISON:  April 11, 2015 FINDINGS: The heart size borderline. The hila and mediastinum are normal. No pneumothorax. No pulmonary nodules, masses, or infiltrates. No overt edema. IMPRESSION: No active cardiopulmonary disease. Electronically Signed   By: Gerome Samavid  Williams III M.D   On: 01/10/2016 16:18    Procedures Procedures (including critical care time) None today  Final Clinical Impressions(s) / UC Diagnoses   Final diagnoses:  Altitude sickness, subsequent encounter  Jet lag  Nonspecific syndrome suggestive of viral illness   Chest xray and ECG today were within normal limits.  Strep test was negative; a throat culture is pending.  I think you have a combination of resolving altitude sickness, jet lag (fatigue) and oncoming respiratory infection.  Ibuprofen and a few doses of prednisone should be helpful.  Rest and push fluids.  Recheck for new fever >100.5, increasing phlegm production/nasal discharge, or if not continuing to slowly improve over the next several days.   New Prescriptions New Prescriptions   IBUPROFEN (ADVIL,MOTRIN) 600 MG TABLET    Take 1 tablet (600 mg total) by mouth 3 (three) times daily as needed.   PREDNISONE (DELTASONE) 50 MG TABLET    Take 1 tablet (50 mg total) by mouth daily.     Eustace MooreLaura W Darlyn Repsher, MD 01/13/16 [id number]

## 2016-01-10 NOTE — Discharge Instructions (Addendum)
Chest xray and ECG today were within normal limits.  Strep test was negative; a throat culture is pending.  I think you have a combination of resolving altitude sickness, jet lag (fatigue) and oncoming respiratory infection.  Ibuprofen and a few doses of prednisone should be helpful.  Rest and push fluids.  Recheck for new fever >100.5, increasing phlegm production/nasal discharge, or if not continuing to slowly improve over the next several days.

## 2016-01-13 LAB — CULTURE, GROUP A STREP (THRC)

## 2016-03-05 DIAGNOSIS — N898 Other specified noninflammatory disorders of vagina: Secondary | ICD-10-CM | POA: Diagnosis not present

## 2016-03-05 DIAGNOSIS — Z114 Encounter for screening for human immunodeficiency virus [HIV]: Secondary | ICD-10-CM | POA: Diagnosis not present

## 2016-03-05 DIAGNOSIS — A64 Unspecified sexually transmitted disease: Secondary | ICD-10-CM | POA: Diagnosis not present

## 2016-03-05 DIAGNOSIS — R300 Dysuria: Secondary | ICD-10-CM | POA: Diagnosis not present

## 2016-03-05 DIAGNOSIS — Z0000 Encounter for general adult medical examination without abnormal findings: Secondary | ICD-10-CM | POA: Diagnosis not present

## 2016-03-05 DIAGNOSIS — R599 Enlarged lymph nodes, unspecified: Secondary | ICD-10-CM | POA: Diagnosis not present

## 2016-03-05 DIAGNOSIS — R590 Localized enlarged lymph nodes: Secondary | ICD-10-CM | POA: Insufficient documentation

## 2016-03-05 DIAGNOSIS — Z23 Encounter for immunization: Secondary | ICD-10-CM | POA: Diagnosis not present

## 2016-03-05 DIAGNOSIS — Z1231 Encounter for screening mammogram for malignant neoplasm of breast: Secondary | ICD-10-CM | POA: Diagnosis not present

## 2016-03-08 DIAGNOSIS — R599 Enlarged lymph nodes, unspecified: Secondary | ICD-10-CM | POA: Diagnosis not present

## 2016-03-08 DIAGNOSIS — K1120 Sialoadenitis, unspecified: Secondary | ICD-10-CM | POA: Diagnosis not present

## 2016-03-08 DIAGNOSIS — R6889 Other general symptoms and signs: Secondary | ICD-10-CM | POA: Diagnosis not present

## 2016-03-15 DIAGNOSIS — F329 Major depressive disorder, single episode, unspecified: Secondary | ICD-10-CM | POA: Diagnosis not present

## 2016-03-15 DIAGNOSIS — K1120 Sialoadenitis, unspecified: Secondary | ICD-10-CM | POA: Diagnosis not present

## 2016-03-15 DIAGNOSIS — R599 Enlarged lymph nodes, unspecified: Secondary | ICD-10-CM | POA: Diagnosis not present

## 2016-03-15 DIAGNOSIS — F17200 Nicotine dependence, unspecified, uncomplicated: Secondary | ICD-10-CM | POA: Diagnosis not present

## 2016-03-15 DIAGNOSIS — N898 Other specified noninflammatory disorders of vagina: Secondary | ICD-10-CM | POA: Diagnosis not present

## 2016-04-13 ENCOUNTER — Ambulatory Visit
Admission: EM | Admit: 2016-04-13 | Discharge: 2016-04-13 | Disposition: A | Discharge status: Home / Self Care | Payer: Medicare Other | Attending: Family Medicine | Admitting: Family Medicine

## 2016-04-13 ENCOUNTER — Encounter: Payer: Self-pay | Admitting: Emergency Medicine

## 2016-04-13 DIAGNOSIS — T148XXA Other injury of unspecified body region, initial encounter: Secondary | ICD-10-CM

## 2016-04-13 DIAGNOSIS — R05 Cough: Secondary | ICD-10-CM

## 2016-04-13 DIAGNOSIS — R059 Cough, unspecified: Secondary | ICD-10-CM

## 2016-04-13 MED ORDER — CYCLOBENZAPRINE HCL 5 MG PO TABS: 5.0000 mg | ORAL_TABLET | Freq: Three times a day (TID) | ORAL | 0 refills | Status: DC | PRN

## 2016-04-13 NOTE — ED Triage Notes (Signed)
Mid to low  back pain for 2 days

## 2016-04-13 NOTE — Discharge Instructions (Signed)
-  Cyclobenzaprine  every 8 hours as needed for muscle soreness/tightness -Tylenol or ibuprofen as needed for pain. -Heating pad may provide some relief as well as a hot bath or shower. -return to clinic or PCP should the pain worsen or not improve. May take several days to a week for the symptoms to resolve. -return to clinic should cough worsen or you develop a fever

## 2016-04-13 NOTE — ED Provider Notes (Signed)
CSN: [id number]     Arrival date & time 04/13/16  1053 History   First MD Initiated Contact with Patient 04/13/16 1208     Chief Complaint  Patient presents with  . Back Pain    Past medical history: depression, obesity, chronic bronchitis, and tobacco dependence who presented today with complaint of back pain. Pt reports that she has had sinus drainage and cough for about two weeks that has improved. The night before last she began having mild back pain and it was worse last night. She reports taking ibuprofen  tablets about every 4 hours for the pain that has helped a little. The pain is described as a throbbing deep pain that gets worse with movement. Pt denies injury, heavy lifting, no numbness/tingling, and no trouble walking. Pt denies urinary symptoms of frequency, hematuria, or pain with urination.      History reviewed. As noted above. History reviewed. No pertinent surgical history. No family history on file. Social History  Substance Use Topics  . Smoking status: Current Every Day Smoker    Packs/day: 0.50    Types: Cigarettes  . Smokeless tobacco: Never Used  . Alcohol use Yes   OB History    No data available     Review of Systems  HENT: Positive for congestion.   Respiratory: Positive for cough.   Musculoskeletal: Positive for back pain.  Neurological: Negative for weakness and numbness.  : As noted above. Remainder of 10 system review found to be negative.  Allergies  Biaxin [clarithromycin]; Codeine; Levaquin [levofloxacin]; and Sulfa antibiotics  Home Medications   Prior to Admission medications   Medication Sig Start Date End Date Taking? Authorizing Provider  albuterol (PROVENTIL HFA;VENTOLIN HFA) 108 (90 Base) MCG/ACT inhaler Inhale 2 puffs into the lungs every 4 (four) hours as needed for wheezing or shortness of breath. 04/12/15  Yes Christiane Ha D Cuthriell, PA-C  cyclobenzaprine (FLEXERIL) 5 MG tablet Take 1 tablet (5 mg total) by mouth 3 (three)  times daily as needed for muscle spasms. 04/13/16   Candis Schatz, PA-C  predniSONE (DELTASONE) 50 MG tablet Take 1 tablet (50 mg total) by mouth daily. 01/10/16   Eustace Moore, MD   Meds Ordered and Administered this Visit  Medications - No data to display  BP 132/86 (BP Location: Left Arm)   Pulse 76   Temp 98.4 F (36.9 C) (Oral)   Resp 16   Ht  (1.702 m)   Wt 215 lb (97.5 kg)   SpO2 98%   BMI 33.67 kg/m  No data found.   Physical Exam  Constitutional: She is oriented to person, place, and time. She appears well-developed and well-nourished.  HENT:  Head: Normocephalic and atraumatic.  Eyes: Pupils are equal, round, and reactive to light.  Neck: Normal range of motion. Neck supple.  Cardiovascular: Normal rate and regular rhythm.   Pulmonary/Chest: Effort normal.  Abdominal: Soft.  Musculoskeletal: Normal range of motion.       Thoracic back: She exhibits tenderness (no tenderness to palpation but tenderness across mid back with bending, deep breath, and cough.). She exhibits normal range of motion, no deformity and no spasm.       Arms: Neurological: She is alert and oriented to person, place, and time.    Urgent Care Course     Procedures : none  Labs Review Labs Reviewed - No data to display  Imaging Review No results found.   MDM   1. Muscle strain  2. Cough    Pt with deep muscle pain/tenderness likely related to frequent cough that she has had over last two weeks. No tenderness to palpation of back or spine but positive pain with movement. No numbness or tingling. Will continue ibuprofen or Tylenol for pain. Will prescribe Flexeril 5 mg every 8 hours for pain/tightness. Advised no driving after taking the muscle relaxant, pt verbalized understanding.   Discussed follow up with Primary care physician if pain should not improve or worsens. Discussed follow up and return parameters including no resolution or any worsening concerns. Patient  verbalized understanding and agreed to plan.      Candis Schatz, PA-C 04/13/16 [phone]

## 2016-04-18 ENCOUNTER — Encounter: Payer: Self-pay | Admitting: Emergency Medicine

## 2016-04-18 ENCOUNTER — Emergency Department: Payer: Medicare Other

## 2016-04-18 ENCOUNTER — Emergency Department
Admission: EM | Admit: 2016-04-18 | Discharge: 2016-04-18 | Disposition: A | Discharge status: Home / Self Care | Payer: Medicare Other | Attending: Emergency Medicine | Admitting: Emergency Medicine

## 2016-04-18 DIAGNOSIS — M546 Pain in thoracic spine: Secondary | ICD-10-CM | POA: Diagnosis not present

## 2016-04-18 DIAGNOSIS — Z79899 Other long term (current) drug therapy: Secondary | ICD-10-CM | POA: Diagnosis not present

## 2016-04-18 DIAGNOSIS — R1013 Epigastric pain: Secondary | ICD-10-CM | POA: Diagnosis not present

## 2016-04-18 DIAGNOSIS — J42 Unspecified chronic bronchitis: Secondary | ICD-10-CM | POA: Diagnosis not present

## 2016-04-18 DIAGNOSIS — F17210 Nicotine dependence, cigarettes, uncomplicated: Secondary | ICD-10-CM | POA: Diagnosis not present

## 2016-04-18 DIAGNOSIS — R079 Chest pain, unspecified: Secondary | ICD-10-CM | POA: Diagnosis not present

## 2016-04-18 DIAGNOSIS — Z6830 Body mass index (BMI) 30.0-30.9, adult: Secondary | ICD-10-CM | POA: Diagnosis not present

## 2016-04-18 DIAGNOSIS — R0789 Other chest pain: Secondary | ICD-10-CM | POA: Diagnosis not present

## 2016-04-18 DIAGNOSIS — E669 Obesity, unspecified: Secondary | ICD-10-CM | POA: Diagnosis not present

## 2016-04-18 DIAGNOSIS — R071 Chest pain on breathing: Secondary | ICD-10-CM | POA: Diagnosis not present

## 2016-04-18 DIAGNOSIS — F329 Major depressive disorder, single episode, unspecified: Secondary | ICD-10-CM | POA: Diagnosis not present

## 2016-04-18 LAB — BASIC METABOLIC PANEL
Anion gap: 4 — ABNORMAL LOW (ref 5–15)
BUN: 12 mg/dL (ref 6–20)
CHLORIDE: 106 mmol/L (ref 101–111)
CO2: 28 mmol/L (ref 22–32)
Calcium: 8.6 mg/dL — ABNORMAL LOW (ref 8.9–10.3)
Creatinine, Ser: 0.49 mg/dL (ref 0.44–1.00)
GFR calc non Af Amer: >60 mL/min (ref >60)
Glucose, Bld: 110 mg/dL — ABNORMAL HIGH (ref 65–99)
Potassium: 3.5 mmol/L (ref 3.5–5.1)
Sodium: 138 mmol/L (ref 135–145)

## 2016-04-18 LAB — URINALYSIS, COMPLETE (UACMP) WITH MICROSCOPIC
Bilirubin Urine: NEGATIVE
GLUCOSE, UA: NEGATIVE mg/dL
HGB URINE DIPSTICK: NEGATIVE
Ketones, ur: NEGATIVE mg/dL
LEUKOCYTES UA: NEGATIVE
NITRITE: NEGATIVE
PH: 6 (ref 5.0–8.0)
Protein, ur: NEGATIVE mg/dL
SPECIFIC GRAVITY, URINE: 1.004 — AB (ref 1.005–1.030)

## 2016-04-18 LAB — CBC
HEMATOCRIT: 36.3 % (ref 35.0–47.0)
HEMOGLOBIN: 12.3 g/dL (ref 12.0–16.0)
MCH: 30.5 pg (ref 26.0–34.0)
MCHC: 33.9 g/dL (ref 32.0–36.0)
MCV: 89.9 fL (ref 80.0–100.0)
PLATELETS: 316 10*3/uL (ref 150–440)
RBC: 4.04 MIL/uL (ref 3.80–5.20)
RDW: 14 % (ref 11.5–14.5)
WBC: 6.3 10*3/uL (ref 3.6–11.0)

## 2016-04-18 LAB — TROPONIN I
Troponin I: <0.03 ng/mL (ref <0.03)
Troponin I: <0.03 ng/mL (ref <0.03)

## 2016-04-18 MED ORDER — KETOROLAC TROMETHAMINE 30 MG/ML IJ SOLN
INTRAMUSCULAR | Status: AC
  Administered 2016-04-18: 30 mg via INTRAVENOUS
  Filled 2016-04-18: qty 1

## 2016-04-18 MED ORDER — OXYCODONE-ACETAMINOPHEN 5-325 MG PO TABS: 2.0000 | ORAL_TABLET | Freq: Four times a day (QID) | ORAL | 0 refills | Status: DC | PRN

## 2016-04-18 MED ORDER — IOPAMIDOL (ISOVUE-370) INJECTION 76%
75.0000 mL | Freq: Once | INTRAVENOUS | Status: AC | PRN
  Administered 2016-04-18: 75 mL via INTRAVENOUS
  Filled 2016-04-18: qty 75

## 2016-04-18 MED ORDER — KETOROLAC TROMETHAMINE 30 MG/ML IJ SOLN
30.0000 mg | Freq: Once | INTRAMUSCULAR | Status: AC
  Administered 2016-04-18: 30 mg via INTRAVENOUS

## 2016-04-18 NOTE — ED Notes (Signed)
Patient transported to CT 

## 2016-04-18 NOTE — ED Triage Notes (Signed)
Pt ambulatory to triage in NAD, reports chest pain radiating to back x 1 week, seen at urgent care and prescribed muscle relaxants.  Pt has been taking w/o relief.  Pt reports feeling some upper abdominal swelling as well.  Pt denies any cardiac hx or hx of risk factors.

## 2016-04-18 NOTE — ED Provider Notes (Signed)
[facility] Emergency Department Provider Note       Time seen: ----------------------------------------- 9:07 PM on 04/18/2016 -----------------------------------------     I have reviewed the triage vital signs and the nursing notes.   HISTORY   Chief Complaint Chest Pain    HPI Karen Reed is a 56 y.o. female who presents to the ED for chest pain that wraps around her chest and radiates into her back for the last week. Patient was seen in urgent care and prescribed muscle relaxants about a week ago. Patient's been taking this without significant improvement. She reports pain from the thoracic region that radiates around under both breasts. She does have pain when she breathes, nothing makes it better or worse. Pain is 8 out of 10 and a squeezing pain.   History reviewed. No pertinent past medical history.  There are no active problems to display for this patient.   History reviewed. No pertinent surgical history.  Allergies Biaxin [clarithromycin]; Codeine; Levaquin [levofloxacin]; and Sulfa antibiotics  Social History Social History  Substance Use Topics  . Smoking status: Current Every Day Smoker    Packs/day: 0.50    Types: Cigarettes  . Smokeless tobacco: Never Used  . Alcohol use Yes    Review of Systems Constitutional: Negative for fever. Cardiovascular: Positive for chest pain Respiratory: Positive for trouble breathing Gastrointestinal: Negative for abdominal pain, vomiting and diarrhea. Genitourinary: Negative for dysuria. Musculoskeletal: Positive for back pain Skin: Negative for rash. Neurological: Negative for headaches, focal weakness or numbness.  10-point ROS otherwise negative.  ____________________________________________   PHYSICAL EXAM:  VITAL SIGNS: ED Triage Vitals  Enc Vitals Group     BP 04/18/16 2004 (!) 142/82     Pulse Rate 04/18/16 2004 91     Resp 04/18/16 2004 18     Temp 04/18/16 2004 98.2  F (36.8 C)     Temp Source 04/18/16 2004 Oral     SpO2 04/18/16 2004 99 %     Weight 04/18/16 2004 229 lb (103.9 kg)     Height 04/18/16 2004  (1.702 m)     Head Circumference --      Peak Flow --      Pain Score 04/18/16 2007 9     Pain Loc --      Pain Edu? --      Excl. in GC? --     Constitutional: Alert and oriented. Mild to moderate distress Eyes: Conjunctivae are normal. PERRL. Normal extraocular movements. ENT   Head: Normocephalic and atraumatic.   Nose: No congestion/rhinnorhea.   Mouth/Throat: Mucous membranes are moist.   Neck: No stridor. Cardiovascular: Normal rate, regular rhythm. No murmurs, rubs, or gallops. Respiratory: Normal respiratory effort without tachypnea nor retractions. Breath sounds are clear and equal bilaterally. No wheezes/rales/rhonchi. Gastrointestinal: Soft and nontender. Normal bowel sounds Musculoskeletal: Nontender with normal range of motion in extremities. No lower extremity tenderness nor edema. Neurologic:  Normal speech and language. No gross focal neurologic deficits are appreciated.  Skin:  Skin is warm, dry and intact. No rash noted. Psychiatric: Mood and affect are normal. Speech and behavior are normal.  ____________________________________________  EKG: Interpreted by me. Sinus rhythm rate of 94 bpm, normal PR interval, normal QRS, normal QT.  ____________________________________________  ED COURSE:  Pertinent labs & imaging results that were available during my care of the patient were reviewed by me and considered in my medical decision making (see chart for details). Patient presents for chest and back  pain, we will assess with labs and imaging as indicated.   Procedures ____________________________________________   LABS (pertinent positives/negatives)  Labs Reviewed  BASIC METABOLIC PANEL - Abnormal; Notable for the following:       Result Value   Glucose, Bld 110 (*)    Calcium 8.6 (*)    Anion  gap 4 (*)    All other components within normal limits  URINALYSIS, COMPLETE (UACMP) WITH MICROSCOPIC - Abnormal; Notable for the following:    Color, Urine STRAW (*)    APPearance CLEAR (*)    Specific Gravity, Urine 1.004 (*)    Bacteria, UA RARE (*)    Squamous Epithelial / LPF 0-5 (*)    All other components within normal limits  CBC  TROPONIN I  TROPONIN I    RADIOLOGY Images were viewed by me  Chest x-ray is normal CTA of the chest IMPRESSION: No acute pulmonary embolus. No aortic aneurysm or dissection. Minimal scarring and/or atelectasis at the lung bases.  IMPRESSION: No active cardiopulmonary disease. ____________________________________________  FINAL ASSESSMENT AND PLAN  Chest and back pain  Plan: Patient's labs and imaging were dictated above. Patient had presented for Back pain that radiated around her chest. No specific etiology was found. She'll be discharged with anti-inflammatory muscle relaxants.   Emily Filbert, MD   Note: This note was generated in part or whole with voice recognition software. Voice recognition is usually quite accurate but there are transcription errors that can and very often do occur. I apologize for any typographical errors that were not detected and corrected.     Emily Filbert, MD 04/18/16 2218

## 2016-07-31 ENCOUNTER — Ambulatory Visit
Admission: EM | Admit: 2016-07-31 | Discharge: 2016-07-31 | Disposition: A | Discharge status: Home / Self Care | Payer: Medicare Other | Attending: Emergency Medicine | Admitting: Emergency Medicine

## 2016-07-31 DIAGNOSIS — G44209 Tension-type headache, unspecified, not intractable: Secondary | ICD-10-CM

## 2016-07-31 DIAGNOSIS — J019 Acute sinusitis, unspecified: Secondary | ICD-10-CM

## 2016-07-31 HISTORY — DX: Allergy status to unspecified drugs, medicaments and biological substances: Z88.9

## 2016-07-31 HISTORY — DX: Unspecified asthma, uncomplicated: J45.909

## 2016-07-31 MED ORDER — FLUTICASONE PROPIONATE 50 MCG/ACT NA SUSP: 2.0000 | Freq: Every day | NASAL | 0 refills | Status: DC

## 2016-07-31 MED ORDER — TIZANIDINE HCL 4 MG PO TABS: 4.0000 mg | ORAL_TABLET | Freq: Three times a day (TID) | ORAL | 0 refills | Status: DC | PRN

## 2016-07-31 MED ORDER — IBUPROFEN 600 MG PO TABS: 600.0000 mg | ORAL_TABLET | Freq: Four times a day (QID) | ORAL | 0 refills | Status: DC | PRN

## 2016-07-31 MED ORDER — ACETAMINOPHEN 500 MG PO TABS
1000.0000 mg | ORAL_TABLET | Freq: Once | ORAL | Status: AC
  Administered 2016-07-31: 1000 mg via ORAL

## 2016-07-31 MED ORDER — AMOXICILLIN-POT CLAVULANATE 875-125 MG PO TABS: 1.0000 | ORAL_TABLET | Freq: Two times a day (BID) | ORAL | 0 refills | Status: DC

## 2016-07-31 NOTE — Discharge Instructions (Signed)
Take the medication as written. Return to the ED if you get worse, have a fever >100.4, arm or leg weakness, facial droop, difficulty getting her words out, slurred speech, if your headache changes or gets worse, another episode of dim vision, blurry vision, double vision. Also ago for chest pain or pressure, shortness of breath, or if you pass out again. You may take 600 mg of motrin with 1 gram of tylenol up to 3-4 times a day as needed for pain. This is an effective combination for pain. Try some Mucinex D to help dry up the postnasal drip and help clear things out. Use a neti pot or the NeilMed sinus rinse as often as you want to to reduce nasal congestion. Follow the directions on the box.   Go to www.goodrx.com to look up your medications. This will give you a list of where you can find your prescriptions at the most affordable prices. Or you can ask the pharmacist what the cash price is. This is frequently cheaper than going through insurance.

## 2016-07-31 NOTE — ED Provider Notes (Signed)
HPI  SUBJECTIVE:  Karen Reed is a 56 y.o. female who reports gradual onset, frontal headache described as pain/pressure and as a "band around my head". His been constant, daily for the past 2 or 3 days. She reports intermittent episodes of dizziness described as lightheadedness worse with turning her head and with large sudden positional changes. Patient reports decreased appetite but states that she has been drinking well. There are no alleviating factors. This resolves on its own. She reports an episode of blurry vision described as her vision "going dim" while sitting outside yesterday. It resolved on its own. Headache did not change during this episode. She denies double vision, visual loss. States that she had an episode of this previously when she had an unexplained episode of syncope. She denies chest pain, pressure, heaviness, palpitations, nausea, diaphoresis, tenderness, tunnel vision with these visual changes. She also reports posterior neck pain described as deep, throbbing, sore. She states that she has been under a lot of stress recently with a death of a close friend. She tried ibuprofen with some improvement in her headache, symptoms are worse with stress, bending forward, lying down. She states that she has had similar headaches before and it was a sinus infection. This is not the first or worst headache of her life, did not occur during exertion. Mild photophobia when outside in the sun, otherwise She denies nausea, vomiting,  phonophobia, fevers, nasal congestion, rhinorrhea. She does report postnasal drip. She also reports 2-3 days of left ear and jaw pain. It is not associated with chewing or yawning. No change in her hearing, otorrhea. No upper dental pain. No facial swelling. She does report left maxillary tenderness with palpation. No arm or leg weakness, facial droop, aphasia, dysarthria, syncope, seizures. She has a past medical history sinusitis, syncope. No history of stroke,  diabetes, hypertension, HIV, arrhythmia, aneurysm. Family history negative for stroke, mother had an aneurysm at age 61. PMD: [facility] internal medicine.   Past Medical History:  Diagnosis Date  . Asthma    Bronchitis induced  . H/O seasonal allergies     History reviewed. No pertinent surgical history.  History reviewed. No pertinent family history.  Social History  Substance Use Topics  . Smoking status: Current Every Day Smoker    Packs/day: 0.50    Types: Cigarettes  . Smokeless tobacco: Current User  . Alcohol use Yes    No current facility-administered medications for this encounter.   Current Outpatient Prescriptions:  .  albuterol (PROVENTIL HFA;VENTOLIN HFA) 108 (90 Base) MCG/ACT inhaler, Inhale 2 puffs into the lungs every 4 (four) hours as needed for wheezing or shortness of breath., Disp: 1 Inhaler, Rfl: 0 .  amoxicillin-clavulanate (AUGMENTIN) 875-125 MG tablet, Take 1 tablet by mouth 2 (two) times daily. X 7 days, Disp: 14 tablet, Rfl: 0 .  fluticasone (FLONASE) 50 MCG/ACT nasal spray, Place 2 sprays into both nostrils daily., Disp: 16 g, Rfl: 0 .  ibuprofen (ADVIL,MOTRIN) 600 MG tablet, Take 1 tablet (600 mg total) by mouth every 6 (six) hours as needed., Disp: 30 tablet, Rfl: 0 .  tiZANidine (ZANAFLEX) 4 MG tablet, Take 1 tablet (4 mg total) by mouth every 8 (eight) hours as needed for muscle spasms., Disp: 30 tablet, Rfl: 0  Allergies  Allergen Reactions  . Biaxin [Clarithromycin] Other (See Comments)  . Codeine Hives  . Levaquin [Levofloxacin] Other (See Comments)    Joint Pain  . Sulfa Antibiotics Hives and Swelling     ROS  As noted in HPI.   Physical Exam  BP (!) 134/91 (BP Location: Left Arm)   Pulse 89   Temp 98.4 F (36.9 C) (Oral)   Resp 18   Ht 5\' 8"  (1.727 m)   Wt 215 lb (97.5 kg)   LMP 07/21/2016 (Exact Date)   SpO2 99%   BMI 32.69 kg/m   Constitutional: Well developed, well nourished, no acute distress Eyes: PERRL, EOMI, conjunctiva  normal bilaterally.  HENT: Normocephalic, atraumatic, mucus membranes moist, normal dentition, no dental tenderness, TM normal b/l. No TMJ tenderness no jaw crepitus or clicking.  minimal nasal congestion.  left maxillary sinus tenderness and tenderness in between her eyebrows. No temporal artery tenderness.  Neck: no cervical LN + trapezial muscle tenderness, especially along splenis capitis and at the insertion of the occiput. No meningismus Lymph: no cervical LN Respiratory: normal inspiratory effort Cardiovascular: Normal rate regular rhythm no murmurs rubs gallops. No carotid bruit  GI:  nondistended skin: No rash, skin intact Musculoskeletal: No edema, no tenderness, no deformities Neurologic: Alert & oriented x 3, CN II-XII intact, romberg neg, finger-> nose, heel-> shin equal b/l, Romberg neg, tandem gait steady. Dix-Hallpike negative. Psychiatric: Speech and behavior appropriate   ED Course  Medications  acetaminophen (TYLENOL) tablet 1,000 mg (1,000 mg Oral Given 07/31/16 1913)    No orders of the defined types were placed in this encounter.  No results found for this or any previous visit (from the past 24 hour(s)). No results found.   ED Clinical Impression  Acute sinusitis, recurrence not specified, unspecified location  Tension headache  ED Assessment/Plan  Had CT in 2012  no sudden onset. Doubt SAH, ICH or space occupying lesion. Pt without fevers/chills, Pt has no meningeal sx, no nuchal rigidity. Doubt meningitis. Pt with normal neuro exam, no evidence of CVA/TIA.  Pt BP not elevated significantly, doubt hypertensive emergency. No evidence of temporal artery tenderness, no evidence of glaucoma or other ocular pathology. Patient took ibuprofen about an hour prior to arrival, so gave her a gram of Tylenol for the pain.  Presentation most consistent with a sinus headache/sinusitis  and a tension headache espeially since patient has been under significantly increased  amount of stress at home but had detailed discussion with patient about the other causes particularly given the episode of dim vision , the intermittent dizziness, and the family history of aneurysm, and offered to do a head CT here tonight. She has however opted to treat this as a sinusitis/tension headache. We'll send home with antibiotics because of the sinus tenderness and significant pain. 600 mg ibuprofen with 1 g of Tylenol, Augmentin, Flonase, Mucinex D, Zanaflex. She  will go to the ER for any change or worsening of her symptoms. Gave patient very strict ER return precautions. She will follow-up with her primary care physician as needed.   Discussed MDM, plan for follow up, signs and sx that should prompt return to ER. Pt agrees with plan  Meds ordered this encounter  Medications  . acetaminophen (TYLENOL) tablet 1,000 mg  . amoxicillin-clavulanate (AUGMENTIN) 875-125 MG tablet    Sig: Take 1 tablet by mouth 2 (two) times daily. X 7 days    Dispense:  14 tablet    Refill:  0  . fluticasone (FLONASE) 50 MCG/ACT nasal spray    Sig: Place 2 sprays into both nostrils daily.    Dispense:  16 g    Refill:  0  . ibuprofen (ADVIL,MOTRIN) 600 MG tablet  Sig: Take 1 tablet (600 mg total) by mouth every 6 (six) hours as needed.    Dispense:  30 tablet    Refill:  0  . tiZANidine (ZANAFLEX) 4 MG tablet    Sig: Take 1 tablet (4 mg total) by mouth every 8 (eight) hours as needed for muscle spasms.    Dispense:  30 tablet    Refill:  0    *This clinic note was created using Scientist, clinical (histocompatibility and immunogenetics)Dragon dictation software. Therefore, there may be occasional mistakes despite careful proofreading.  ?   Domenick GongMortenson, Keiyon Plack, MD 07/31/16 1944

## 2016-07-31 NOTE — ED Triage Notes (Signed)
56 year old [place] woman is here today with her daughter with complaints of sinus pressure,  Left ear pain, sinus headache, dizziness for the last 2-3 days. She states she has taken Ibuprofen to help with the  Headache.

## 2016-08-14 DIAGNOSIS — H1045 Other chronic allergic conjunctivitis: Secondary | ICD-10-CM | POA: Diagnosis not present

## 2016-09-10 DIAGNOSIS — L821 Other seborrheic keratosis: Secondary | ICD-10-CM | POA: Diagnosis not present

## 2016-09-10 DIAGNOSIS — L750 Bromhidrosis: Secondary | ICD-10-CM | POA: Diagnosis not present

## 2016-09-10 DIAGNOSIS — L700 Acne vulgaris: Secondary | ICD-10-CM | POA: Diagnosis not present

## 2016-09-11 DIAGNOSIS — M549 Dorsalgia, unspecified: Secondary | ICD-10-CM | POA: Diagnosis not present

## 2016-09-11 DIAGNOSIS — M25562 Pain in left knee: Secondary | ICD-10-CM | POA: Diagnosis not present

## 2016-09-11 DIAGNOSIS — M25519 Pain in unspecified shoulder: Secondary | ICD-10-CM | POA: Diagnosis not present

## 2016-09-11 DIAGNOSIS — M791 Myalgia: Secondary | ICD-10-CM | POA: Diagnosis not present

## 2016-09-11 DIAGNOSIS — M25561 Pain in right knee: Secondary | ICD-10-CM | POA: Diagnosis not present

## 2016-09-11 DIAGNOSIS — R7989 Other specified abnormal findings of blood chemistry: Secondary | ICD-10-CM | POA: Diagnosis not present

## 2016-09-11 DIAGNOSIS — M19012 Primary osteoarthritis, left shoulder: Secondary | ICD-10-CM | POA: Diagnosis not present

## 2016-09-13 ENCOUNTER — Encounter: Payer: Self-pay | Admitting: *Deleted

## 2016-09-13 ENCOUNTER — Ambulatory Visit
Admission: EM | Admit: 2016-09-13 | Discharge: 2016-09-13 | Disposition: A | Discharge status: Home / Self Care | Payer: Medicare Other | Attending: Family Medicine | Admitting: Family Medicine

## 2016-09-13 DIAGNOSIS — S86111A Strain of other muscle(s) and tendon(s) of posterior muscle group at lower leg level, right leg, initial encounter: Secondary | ICD-10-CM | POA: Diagnosis not present

## 2016-09-13 HISTORY — DX: Sjogren syndrome, unspecified: M35.00

## 2016-09-13 MED ORDER — OXYCODONE-ACETAMINOPHEN 5-325 MG PO TABS: 1.0000 | ORAL_TABLET | Freq: Three times a day (TID) | ORAL | 0 refills | Status: DC | PRN

## 2016-09-13 MED ORDER — MELOXICAM 15 MG PO TABS: 15.0000 mg | ORAL_TABLET | Freq: Every day | ORAL | 0 refills | Status: DC | PRN

## 2016-09-13 NOTE — Discharge Instructions (Signed)
Take medication as prescribed. Rest. Ice. Use crutches. Drink plenty of fluids.   Follow up with orthopedic this week for continued pain, as discussed.   Follow up with your primary care physician this week as needed. Return to Urgent care for new or worsening concerns.

## 2016-09-13 NOTE — ED Triage Notes (Signed)
Patient tripped over a stepping stone 1 hour before arrival. Patient reports pain in her right calf. No previous lower right leg injuries reported.

## 2016-09-13 NOTE — ED Provider Notes (Signed)
[facility] ____________________________________________  Time seen: Approximately 4:17 PM  I have reviewed the triage vital signs and the nursing notes.   HISTORY  Chief Complaint Leg Pain  HPI  Karen Reed is a 56 y.o. female presenting with son at bedside for evaluation of right calf pain that had sudden onset from injury that occurred just prior to arrival. Patient reports approximately one hour ago she was outside but when she tried to step over a stepping stone, she tripped and fell. Patient states that she did not fall to the ground fully. Patient states that she caught herself on a railing area directly next to her. States that she did not actually hit her leg on anything. States when she tripped she heard a ripping tearing sound to her right calf, with onset of pain. States pain has consistently stayed in that same area. Denies pain radiation, appears seizures were decreased range of motion. Patient does state however she has full range of motion but pain to do so since she is keeping it still. States she is having difficulty walking on the leg and has to walk on her toes. Denies any history of anything similar in the past. Denies any chronic issues with her right lower leg. States she does have some chronic pain to bilateral knees with associated osteoarthritis previously has had joint injections. Again denies any knee pain at this time. Denies head injury or loss of consciousness. Reports otherwise feels well. States mild/moderate pain and right calf at this time. States pain prior to injury. States to take 800 mg of ibuprofen prior to arrival without pain changes. No other alleviating measures attempted.   Denies chest pain, shortness of breath, abdominal pain, or rash. Denies recent sickness. Denies recent antibiotic use.  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 Asthma    Bronchitis induced  . H/O seasonal allergies   . Sjogren's  disease (HCC)     There are no active problems to display for this patient.   History reviewed. No pertinent surgical history.   No current facility-administered medications for this encounter.   Current Outpatient Prescriptions:  .  fluticasone (FLONASE) 50 MCG/ACT nasal spray, Place 2 sprays into both nostrils daily., Disp: 16 g, Rfl: 0 .  ibuprofen (ADVIL,MOTRIN) 600 MG tablet, Take 1 tablet (600 mg total) by mouth every 6 (six) hours as needed., Disp: 30 tablet, Rfl: 0 .  albuterol (PROVENTIL HFA;VENTOLIN HFA) 108 (90 Base) MCG/ACT inhaler, Inhale 2 puffs into the lungs every 4 (four) hours as needed for wheezing or shortness of breath., Disp: 1 Inhaler, Rfl: 0 .  amoxicillin-clavulanate (AUGMENTIN) 875-125 MG tablet, Take 1 tablet by mouth 2 (two) times daily. X 7 days, Disp: 14 tablet, Rfl: 0 .  meloxicam (MOBIC) 15 MG tablet, Take 1 tablet (15 mg total) by mouth daily as needed., Disp: 10 tablet, Rfl: 0 .  oxyCODONE-acetaminophen (ROXICET) 5-325 MG tablet, Take 1 tablet by mouth every 8 (eight) hours as needed for moderate pain or severe pain (Do not drive or operate heavy machinery while taking as can cause drowsiness.)., Disp: 9 tablet, Rfl: 0 .  tiZANidine (ZANAFLEX) 4 MG tablet, Take 1 tablet (4 mg total) by mouth every 8 (eight) hours as needed for muscle spasms., Disp: 30 tablet, Rfl: 0  Allergies Biaxin [clarithromycin]; Codeine; Levaquin [levofloxacin]; and Sulfa antibiotics  History reviewed. No pertinent family history.  Social History Social History  Substance Use Topics  . Smoking  status: Current Every Day Smoker    Packs/day: 0.50    Types: Cigarettes  . Smokeless tobacco: Current User  . Alcohol use Yes    Review of Systems Constitutional: No fever/chills Cardiovascular: Denies chest pain. Respiratory: Denies shortness of breath. Gastrointestinal: No abdominal pain. Musculoskeletal: Negative for back pain. As above.  Skin: Negative for  rash.   ____________________________________________   PHYSICAL EXAM:  VITAL SIGNS: ED Triage Vitals  Enc Vitals Group     BP 09/13/16 1522 (!) 151/73     Pulse Rate 09/13/16 1522 72     Resp 09/13/16 1522 16     Temp 09/13/16 1522 98.2 F (36.8 C)     Temp Source 09/13/16 1522 Oral     SpO2 09/13/16 1522 99 %     Weight --      Height --      Head Circumference --      Peak Flow --      Pain Score 09/13/16 1524 10     Pain Loc --      Pain Edu? --      Excl. in GC? --     Constitutional: Alert and oriented. Well appearing and in no acute distress. Cardiovascular: Normal rate, regular rhythm. Grossly normal heart sounds.  Good peripheral circulation. Respiratory: Normal respiratory effort without tachypnea nor retractions. Breath sounds are clear and equal bilaterally. No wheezes, rales, rhonchi. Musculoskeletal:  No midline cervical, thoracic or lumbar tenderness to palpation. Bilateral pedal pulses equal and easily palpated. Except: Right leg held in plantar flexion, moderate tenderness directly to palpation to posterior gastrocnemius muscle, no anterior right lower leg tenderness, no tibial or fibular tenderness to direct palpation, no pain along the Achilles palpated, negative Thompson squeeze test, ambulatory with antalgic gait and walking on right tiptoes, pain with dorsiflexion but full range of motion present, no right knee tenderness, right lower extremity otherwise nontender. Right lower extremity normal distal sensation and capillary refill. Neurologic:  Normal speech and language. Speech is normal.  Skin:  Skin is warm, dry. Psychiatric: Mood and affect are normal. Speech and behavior are normal. Patient exhibits appropriate insight and judgment   ___________________________________________   LABS (all labs ordered are listed, but only abnormal results are displayed)  Labs Reviewed - No data to  display ____________________________________________   PROCEDURES Procedures    INITIAL IMPRESSION / ASSESSMENT AND PLAN / ED COURSE  Pertinent labs & imaging results that were available during my care of the patient were reviewed by me and considered in my medical decision making (see chart for details).  Well-appearing patient. Sudden onset of right calf pain from injury earlier today. No direct trauma or direct injury per patient. Denies other complaints. No bony tenderness. Suspect gastrocnemius muscle strain versus tear. Encouraged rest, ice and supportive care. walker sent home with patient. Discussed range of motion exercises. Discussed evaluation x-ray, but as patient has no point bony tenderness and due to mechanism of injury, will defer xray at this time, patient agrees. Daily Mobic and when necessary Percocet. Patient alert reports allergic to codeine but tolerates Percocet fine. Follow-up with orthopedics this week.   [place] controlled substance database reviewed, and no recent controlled medications documented.   Discussed follow up with Primary care physician this week. Discussed follow up and return parameters including no resolution or any worsening concerns. Patient verbalized understanding and agreed to plan.   ____________________________________________   FINAL CLINICAL IMPRESSION(S) / ED DIAGNOSES  Final diagnoses:  Strain of  right gastrocnemius muscle, initial encounter     Discharge Medication List as of 09/13/2016  4:22 PM    START taking these medications   Details  meloxicam (MOBIC) 15 MG tablet Take 1 tablet (15 mg total) by mouth daily as needed., Starting Fri 09/13/2016, Normal    oxyCODONE-acetaminophen (ROXICET) 5-325 MG tablet Take 1 tablet by mouth every 8 (eight) hours as needed for moderate pain or severe pain (Do not drive or operate heavy machinery while taking as can cause drowsiness.)., Starting Fri 09/13/2016, Print        Note:  This dictation was prepared with Dragon dictation along with smaller phrase technology. Any transcriptional errors that result from this process are unintentional.        Renford Dills, NP 09/13/16 1747

## 2016-09-17 DIAGNOSIS — H1045 Other chronic allergic conjunctivitis: Secondary | ICD-10-CM | POA: Diagnosis not present

## 2016-09-25 ENCOUNTER — Ambulatory Visit
Admission: EM | Admit: 2016-09-25 | Discharge: 2016-09-25 | Disposition: A | Discharge status: Home / Self Care | Payer: Medicare Other | Attending: Family Medicine | Admitting: Family Medicine

## 2016-09-25 ENCOUNTER — Encounter: Payer: Self-pay | Admitting: *Deleted

## 2016-09-25 DIAGNOSIS — B9789 Other viral agents as the cause of diseases classified elsewhere: Secondary | ICD-10-CM | POA: Diagnosis not present

## 2016-09-25 DIAGNOSIS — J069 Acute upper respiratory infection, unspecified: Secondary | ICD-10-CM | POA: Diagnosis not present

## 2016-09-25 MED ORDER — FLUTICASONE PROPIONATE 50 MCG/ACT NA SUSP: 1.0000 | Freq: Every day | NASAL | 0 refills | Status: DC

## 2016-09-25 MED ORDER — HYDROCOD POLST-CPM POLST ER 10-8 MG/5ML PO SUER: 5.0000 mL | Freq: Every evening | ORAL | 0 refills | Status: DC | PRN

## 2016-09-25 MED ORDER — BENZONATATE 100 MG PO CAPS: 100.0000 mg | ORAL_CAPSULE | Freq: Three times a day (TID) | ORAL | 0 refills | Status: DC | PRN

## 2016-09-25 NOTE — Discharge Instructions (Signed)
Take medication as prescribed. Rest. Drink plenty of fluids.  ° °Follow up with your primary care physician this week as needed. Return to Urgent care for new or worsening concerns.  ° °

## 2016-09-25 NOTE — ED Triage Notes (Signed)
Patient started having symptoms of nasal congestion / drainage, cough, and headache 3 days ago.

## 2016-09-25 NOTE — ED Provider Notes (Signed)
[facility] ____________________________________________  Time seen: Approximately 6:54 PM  I have reviewed the triage vital signs and the nursing notes.   HISTORY  Chief Complaint Nasal Congestion and Cough   HPI Karen Reed is a 56 y.o. female present for evaluation of 3 days of runny nose, nasal congestion and cough. Reports some hot and cold chills, denies known fevers. States sinus pressure. States symptoms have resolved with over-the-counter ibuprofen and Claritin. Denies sore throat. Reports has continued overall eat and drink well. States granddaughter recently with some similar complaints. Denies other known sick contacts. States also has been using some vitamin C and over-the-counter herbal treatments. Reports otherwise feels well. Denies other complaints. No otc medications taken today.  Denies chest pain, shortness of breath, abdominal pain, dysuria, or rash. Denies recent sickness. Denies recent antibiotic use.   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 Asthma    Bronchitis induced  . H/O seasonal allergies   . Sjogren's disease (HCC)     There are no active problems to display for this patient.   History reviewed. No pertinent surgical history.   No current facility-administered medications for this encounter.   Current Outpatient Prescriptions:  .  fluticasone (FLONASE) 50 MCG/ACT nasal spray, Place 2 sprays into both nostrils daily., Disp: 16 g, Rfl: 0 .  ibuprofen (ADVIL,MOTRIN) 600 MG tablet, Take 1 tablet (600 mg total) by mouth every 6 (six) hours as needed., Disp: 30 tablet, Rfl: 0 .  albuterol (PROVENTIL HFA;VENTOLIN HFA) 108 (90 Base) MCG/ACT inhaler, Inhale 2 puffs into the lungs every 4 (four) hours as needed for wheezing or shortness of breath., Disp: 1 Inhaler, Rfl: 0 .  amoxicillin-clavulanate (AUGMENTIN) 875-125 MG tablet, Take 1 tablet by mouth 2 (two) times daily. X 7 days, Disp: 14 tablet, Rfl:  0 .  benzonatate (TESSALON PERLES) 100 MG capsule, Take 1 capsule (100 mg total) by mouth 3 (three) times daily as needed., Disp: 15 capsule, Rfl: 0 .  chlorpheniramine-HYDROcodone (TUSSIONEX PENNKINETIC ER) 10-8 MG/5ML SUER, Take 5 mLs by mouth at bedtime as needed. do not drive or operate machinery while taking as can cause drowsiness., Disp: 75 mL, Rfl: 0 .  fluticasone (FLONASE) 50 MCG/ACT nasal spray, Place 1 spray into both nostrils daily., Disp: 1 g, Rfl: 0 .  meloxicam (MOBIC) 15 MG tablet, Take 1 tablet (15 mg total) by mouth daily as needed., Disp: 10 tablet, Rfl: 0 .  oxyCODONE-acetaminophen (ROXICET) 5-325 MG tablet, Take 1 tablet by mouth every 8 (eight) hours as needed for moderate pain or severe pain (Do not drive or operate heavy machinery while taking as can cause drowsiness.)., Disp: 9 tablet, Rfl: 0 .  tiZANidine (ZANAFLEX) 4 MG tablet, Take 1 tablet (4 mg total) by mouth every 8 (eight) hours as needed for muscle spasms., Disp: 30 tablet, Rfl: 0  Allergies Biaxin [clarithromycin]; Codeine; Levaquin [levofloxacin]; and Sulfa antibiotics  History reviewed. No pertinent family history.  Social History Social History  Substance Use Topics  . Smoking status: Current Every Day Smoker    Packs/day: 0.50    Types: Cigarettes  . Smokeless tobacco: Current User  . Alcohol use Yes    Review of Systems Constitutional:AS above.  Eyes: No visual changes. ENT: No sore throat. AS above.  Cardiovascular: Denies chest pain. Respiratory: Denies shortness of breath. Gastrointestinal: No abdominal pain.  No nausea, no vomiting.  No diarrhea.  No constipation. Genitourinary: Negative for dysuria. Musculoskeletal: Negative for  back pain. Skin: Negative for rash.  ____________________________________________   PHYSICAL EXAM:  VITAL SIGNS: ED Triage Vitals  Enc Vitals Group     BP 09/25/16 1755 (!) 141/83     Pulse Rate 09/25/16 1755 89     Resp 09/25/16 1755 16     Temp  09/25/16 1755 98.5 F (36.9 C)     Temp Source 09/25/16 1755 Oral     SpO2 09/25/16 1755 99 %     Weight --      Height --      Head Circumference --      Peak Flow --      Pain Score 09/25/16 1757 0     Pain Loc --      Pain Edu? --      Excl. in GC? --     Constitutional: Alert and oriented. Well appearing and in no acute distress. Eyes: Conjunctivae are normal.  Head: Atraumatic.Mild tenderness to palpation bilateral frontal and maxillary sinuses. No swelling. No erythema.   Ears: no erythema, normal TMs bilaterally.   Nose: nasal congestion with bilateral nasal turbinate erythema and edema.   Mouth/Throat: Mucous membranes are moist.  Oropharynx non-erythematous.No tonsillar swelling or exudate.  Neck: No stridor.  No cervical spine tenderness to palpation. Hematological/Lymphatic/Immunilogical: No cervical lymphadenopathy. Cardiovascular: Normal rate, regular rhythm. Grossly normal heart sounds.  Good peripheral circulation. Respiratory: Normal respiratory effort.  No retractions. No wheezes, rales or rhonchi. Good air movement.  Gastrointestinal: Soft and nontender. . Musculoskeletal: Steady gait.  Neurologic:  Normal speech and language. No gait instability. Skin:  Skin is warm, dry. Psychiatric: Mood and affect are normal. Speech and behavior are normal.  ___________________________________________   LABS (all labs ordered are listed, but only abnormal results are displayed)  Labs Reviewed - No data to display ____________________________________________   PROCEDURES Procedures    INITIAL IMPRESSION / ASSESSMENT AND PLAN / ED COURSE  Pertinent labs & imaging results that were available during my care of the patient were reviewed by me and considered in my medical decision making (see chart for details).  Well-appearing patient. No acute distress. Suspect viral upper respiratory infection with cough. Encouraged rest, fluids, supportive care. Patient reports she  has tolerated Tussionex well in past, when necessary, Tussionex,Tessalon Perles and Flonase, continue Claritin. Discussed strict follow up and return parameters.Discussed indication, risks and benefits of medications with patient.  Discussed follow up with Primary care physician this week. Discussed follow up and return parameters including no resolution or any worsening concerns. Patient verbalized understanding and agreed to plan.   ____________________________________________   FINAL CLINICAL IMPRESSION(S) / ED DIAGNOSES  Final diagnoses:  Viral URI with cough     Discharge Medication List as of 09/25/2016  7:06 PM    START taking these medications   Details  benzonatate (TESSALON PERLES) 100 MG capsule Take 1 capsule (100 mg total) by mouth 3 (three) times daily as needed., Starting Wed 09/25/2016, Normal    chlorpheniramine-HYDROcodone (TUSSIONEX PENNKINETIC ER) 10-8 MG/5ML SUER Take 5 mLs by mouth at bedtime as needed. do not drive or operate machinery while taking as can cause drowsiness., Starting Wed 09/25/2016, Print        Note: This dictation was prepared with Dragon dictation along with smaller phrase technology. Any transcriptional errors that result from this process are unintentional.         Renford Dills, NP 09/25/16 1951

## 2016-10-01 ENCOUNTER — Encounter: Payer: Self-pay | Admitting: *Deleted

## 2016-10-01 ENCOUNTER — Ambulatory Visit
Admission: EM | Admit: 2016-10-01 | Discharge: 2016-10-01 | Disposition: A | Discharge status: Home / Self Care | Payer: Medicare Other | Attending: Family Medicine | Admitting: Family Medicine

## 2016-10-01 DIAGNOSIS — J0100 Acute maxillary sinusitis, unspecified: Secondary | ICD-10-CM

## 2016-10-01 DIAGNOSIS — R05 Cough: Secondary | ICD-10-CM

## 2016-10-01 DIAGNOSIS — R059 Cough, unspecified: Secondary | ICD-10-CM

## 2016-10-01 DIAGNOSIS — J9801 Acute bronchospasm: Secondary | ICD-10-CM

## 2016-10-01 MED ORDER — ALBUTEROL SULFATE HFA 108 (90 BASE) MCG/ACT IN AERS: 2.0000 | INHALATION_SPRAY | RESPIRATORY_TRACT | 0 refills | Status: DC | PRN

## 2016-10-01 MED ORDER — DOXYCYCLINE HYCLATE 100 MG PO CAPS: 100.0000 mg | ORAL_CAPSULE | Freq: Two times a day (BID) | ORAL | 0 refills | Status: DC

## 2016-10-01 MED ORDER — PREDNISONE 10 MG PO TABS: ORAL_TABLET | ORAL | 0 refills | Status: DC

## 2016-10-01 MED ORDER — FLUCONAZOLE 150 MG PO TABS: 150.0000 mg | ORAL_TABLET | Freq: Every day | ORAL | 0 refills | Status: DC

## 2016-10-01 NOTE — Discharge Instructions (Signed)
Take medication as prescribed. Rest. Drink plenty of fluids.  ° °Follow up with your primary care physician this week as needed. Return to Urgent care for new or worsening concerns.  ° °

## 2016-10-01 NOTE — ED Triage Notes (Signed)
Seen here 1 week ago for head congestion cough, and dyspnea. Here tonight as symptoms have worsened and cough is now productive-brown.

## 2016-10-01 NOTE — ED Provider Notes (Signed)
[facility] ____________________________________________  Time seen: Approximately 9:02 PM  I have reviewed the triage vital signs and the nursing notes.   HISTORY  Chief Complaint Cough; Nasal Congestion   HPI Karen Reed is a 56 y.o. female presenting for evaluation of just over 1 week of runny nose, nasal congestion, cough. Patient reports she was seen in urgent care last week for similar complaints at that time she only been sick for just a couple of days. States symptoms continued to have nasal drainage with associated nasal pressure and postnasal drainage and feels that this led to congestion in her chest. Patient reports last few days she has had increased cough and chest congestion sensation. Denies any chest pain or shortness of breath, but reports chest congestion makes her occasionally winded with exertion. Patient states that she has had some intermittent wheezing, primarily at night with associated post nasal drainage and increased cough. Denies any associated fevers, sore throat, ear discomfort. Reports some sinus pressure. Reports getting a lot of nasal drainage out that has some greenish to brownish drainage. Reports cough is primarily dry hacking cough occasionally has some whitish mucus. Denies hemoptysis. Reports overall continues to eat and drink well, decreased appetite. Reports has continued to remain active. States unresolved with Lawyer and Flonase. States she has not had her albuterol inhaler at home as she has ran out. States was unable to fill the previous Tussionex prescription to to insurance not covering. Reports cough keeping her up at night. Patient reports she is a chronic smoker, and denies history of COPD, asthma or chronic bronchitis.  Denies chest pain, shortness of breath, chest pain with deep breath, abdominal pain, dysuria, extremity pain, extremity swelling or rash. Denies recent surgeries, immobilizations or trips. Denies recent  antibiotic use. Denies cardiac history. Denies renal insufficiency.  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 Asthma    Bronchitis induced  . H/O seasonal allergies   . Sjogren's disease (HCC)     There are no active problems to display for this patient.   History reviewed. No pertinent surgical history.   No current facility-administered medications for this encounter.   Current Outpatient Prescriptions:  .  fluticasone (FLONASE) 50 MCG/ACT nasal spray, Place 2 sprays into both nostrils daily., Disp: 16 g, Rfl: 0 .  fluticasone (FLONASE) 50 MCG/ACT nasal spray, Place 1 spray into both nostrils daily., Disp: 1 g, Rfl: 0 .  ibuprofen (ADVIL,MOTRIN) 600 MG tablet, Take 1 tablet (600 mg total) by mouth every 6 (six) hours as needed., Disp: 30 tablet, Rfl: 0 .  albuterol (PROVENTIL HFA;VENTOLIN HFA) 108 (90 Base) MCG/ACT inhaler, Inhale 2 puffs into the lungs every 4 (four) hours as needed for wheezing or shortness of breath., Disp: 1 Inhaler, Rfl: 0 .  albuterol (PROVENTIL HFA;VENTOLIN HFA) 108 (90 Base) MCG/ACT inhaler, Inhale 2 puffs into the lungs every 4 (four) hours as needed for wheezing., Disp: 1 Inhaler, Rfl: 0 .  amoxicillin-clavulanate (AUGMENTIN) 875-125 MG tablet, Take 1 tablet by mouth 2 (two) times daily. X 7 days, Disp: 14 tablet, Rfl: 0 .  benzonatate (TESSALON PERLES) 100 MG capsule, Take 1 capsule (100 mg total) by mouth 3 (three) times daily as needed., Disp: 15 capsule, Rfl: 0 .  chlorpheniramine-HYDROcodone (TUSSIONEX PENNKINETIC ER) 10-8 MG/5ML SUER, Take 5 mLs by mouth at bedtime as needed. do not drive or operate machinery while taking as can cause drowsiness., Disp: 75 mL, Rfl: 0 .  doxycycline (  VIBRAMYCIN) 100 MG capsule, Take 1 capsule (100 mg total) by mouth 2 (two) times daily., Disp: 20 capsule, Rfl: 0 .  fluconazole (DIFLUCAN) 150 MG tablet, Take 1 tablet (150 mg total) by mouth daily. Take one pill orally, then Repeat in one  week as needed., Disp: 1 tablet, Rfl: 0 .  meloxicam (MOBIC) 15 MG tablet, Take 1 tablet (15 mg total) by mouth daily as needed., Disp: 10 tablet, Rfl: 0 .  oxyCODONE-acetaminophen (ROXICET) 5-325 MG tablet, Take 1 tablet by mouth every 8 (eight) hours as needed for moderate pain or severe pain (Do not drive or operate heavy machinery while taking as can cause drowsiness.)., Disp: 9 tablet, Rfl: 0 .  predniSONE (DELTASONE) 10 MG tablet, Start 60 mg po day one, then 50 mg po day two, taper by 10 mg daily until complete., Disp: 21 tablet, Rfl: 0 .  tiZANidine (ZANAFLEX) 4 MG tablet, Take 1 tablet (4 mg total) by mouth every 8 (eight) hours as needed for muscle spasms., Disp: 30 tablet, Rfl: 0  Allergies Biaxin [clarithromycin]; Codeine; Levaquin [levofloxacin]; and Sulfa antibiotics  History reviewed. No pertinent family history.  Social History Social History  Substance Use Topics  . Smoking status: Current Every Day Smoker    Packs/day: 0.50    Types: Cigarettes  . Smokeless tobacco: Current User  . Alcohol use Yes    Review of Systems Constitutional: No fever/chills Eyes: No visual changes. ENT: No sore throat. Cardiovascular: Denies chest pain. Respiratory: Denies shortness of breath.As above. Gastrointestinal: No abdominal pain.  No nausea, no vomiting.  No diarrhea.  No constipation. Genitourinary: Negative for dysuria. Musculoskeletal: Negative for back pain. Skin: Negative for rash.  ____________________________________________   PHYSICAL EXAM:  VITAL SIGNS: ED Triage Vitals  Enc Vitals Group     BP 10/01/16 2022 138/89     Pulse Rate 10/01/16 2022 83     Resp 10/01/16 2022 16     Temp 10/01/16 2022 98.9 F (37.2 C)     Temp Source 10/01/16 2022 Oral     SpO2 10/01/16 2022 99 %     Weight 10/01/16 2023 220 lb (99.8 kg)     Height 10/01/16 2023  (1.702 m)     Head Circumference --      Peak Flow --      Pain Score 10/01/16 2100 0     Pain Loc --       Pain Edu? --      Excl. in GC? --    Constitutional: Alert and oriented. Well appearing and in no acute distress. Eyes: Conjunctivae are normal.  Head: Atraumatic.Mild to moderate tenderness to palpation bilateral maxillary sinuses, nontender frontal sinuses. No swelling. No erythema.   Ears: no erythema, normal TMs bilaterally.   Nose: nasal congestion with bilateral nasal turbinate erythema and edema.   Mouth/Throat: Mucous membranes are moist.  Oropharynx non-erythematous.No tonsillar swelling or exudate.  Neck: No stridor.  No cervical spine tenderness to palpation. Hematological/Lymphatic/Immunilogical: No cervical lymphadenopathy. Cardiovascular: Normal rate, regular rhythm. Grossly normal heart sounds.  Good peripheral circulation. Respiratory: Normal respiratory effort.  No retractions. No wheezes. Mild scattered rhonchi. Good air movement. Dry intermittent cough noted in room with bronchospasm wheeze. Speaks in complete sentences. No focal area of consolidation. Gastrointestinal: Soft and nontender.  Musculoskeletal:  Bilateral pedal pulses equal and easily palpated. No cervical, thoracic or lumbar tenderness to palpation.  Neurologic:  Normal speech and language. No gait instability. Skin:  Skin is warm, dry  and intact. No rash noted. Psychiatric: Mood and affect are normal. Speech and behavior are normal.  ___________________________________________   LABS (all labs ordered are listed, but only abnormal results are displayed)  Labs Reviewed - No data to display  PROCEDURES Procedures    INITIAL IMPRESSION / ASSESSMENT AND PLAN / ED COURSE  Pertinent labs & imaging results that were available during my care of the patient were reviewed by me and considered in my medical decision making (see chart for details).  Well appearing patient. No acute distress. Suspect maxillary sinusitis with post nasal drainage with secondary bronchospasm. Will see patient with oral prednisone  taper, when necessary albuterol inhaler, doxycycline. Patient requests 1 tablet Diflucan as needed due to history of yeast infections vaginally with oral and of Rx. Discussed strict follow-up and return parameters. Discuss attenuation chest x-ray with patient, patient declines at this time and states will have follow-up and reevaluation if complaints continued chest x-ray at that time.Discussed indication, risks and benefits of medications with patient.  Discussed follow up with Primary care physician this week. Discussed follow up and return parameters including no resolution or any worsening concerns. Patient verbalized understanding and agreed to plan.   ____________________________________________   FINAL CLINICAL IMPRESSION(S) / ED DIAGNOSES  Final diagnoses:  Acute maxillary sinusitis, recurrence not specified  Bronchospasm  Cough     New Prescriptions   ALBUTEROL (PROVENTIL HFA;VENTOLIN HFA) 108 (90 BASE) MCG/ACT INHALER    Inhale 2 puffs into the lungs every 4 (four) hours as needed for wheezing.   DOXYCYCLINE (VIBRAMYCIN) 100 MG CAPSULE    Take 1 capsule (100 mg total) by mouth 2 (two) times daily.   FLUCONAZOLE (DIFLUCAN) 150 MG TABLET    Take 1 tablet (150 mg total) by mouth daily. Take one pill orally, then Repeat in one week as needed.   PREDNISONE (DELTASONE) 10 MG TABLET    Start 60 mg po day one, then 50 mg po day two, taper by 10 mg daily until complete.    Note: This dictation was prepared with Dragon dictation along with smaller phrase technology. Any transcriptional errors that result from this process are unintentional.         Renford Dills, NP 10/01/16 2138

## 2016-10-02 DIAGNOSIS — L700 Acne vulgaris: Secondary | ICD-10-CM | POA: Diagnosis not present

## 2016-10-04 ENCOUNTER — Telehealth: Payer: Self-pay | Admitting: Emergency Medicine

## 2016-10-04 MED ORDER — AMOXICILLIN 875 MG PO TABS: 875.0000 mg | ORAL_TABLET | Freq: Two times a day (BID) | ORAL | 0 refills | Status: AC

## 2016-10-04 NOTE — Telephone Encounter (Signed)
Patient returned call re: possible allergic reaction to Doxycycline 100 mg. Patient c/o weakness, lethargy and tingling in her face.  Spoke with Dr. Judd Gaudier and he recommends patient stop Doxy and start Amox.  bid x 10day to be sent to [facility] [place]. Patient denies allergy to Amox.

## 2016-10-04 NOTE — Telephone Encounter (Signed)
Patient had left a message on our office phone.  Patient had been seen at [facility] yesterday and was being treated for Sinusitis with Doxycycline.  Patient stated in her message that after she took the Doxycycline she felt weak and tingling in her face.  I tried calling patient and there was no answer, so I left a message for her to call us back.

## 2016-11-06 ENCOUNTER — Encounter: Payer: Self-pay | Admitting: Emergency Medicine

## 2016-11-06 ENCOUNTER — Ambulatory Visit
Admission: EM | Admit: 2016-11-06 | Discharge: 2016-11-06 | Disposition: A | Discharge status: Home / Self Care | Payer: Medicare Other | Attending: Family Medicine | Admitting: Family Medicine

## 2016-11-06 DIAGNOSIS — J0110 Acute frontal sinusitis, unspecified: Secondary | ICD-10-CM

## 2016-11-06 DIAGNOSIS — R05 Cough: Secondary | ICD-10-CM

## 2016-11-06 MED ORDER — AMOXICILLIN 875 MG PO TABS: 875.0000 mg | ORAL_TABLET | Freq: Two times a day (BID) | ORAL | 0 refills | Status: DC

## 2016-11-06 MED ORDER — BENZONATATE 100 MG PO CAPS: 100.0000 mg | ORAL_CAPSULE | Freq: Three times a day (TID) | ORAL | 0 refills | Status: DC | PRN

## 2016-11-06 NOTE — ED Provider Notes (Signed)
[facility]    CSN: [id number] Arrival date & time: 11/06/16  1704     History   Chief Complaint Chief Complaint  Patient presents with  . Cough  . sinus congestion    HPI Karen Reed is a 56 y.o. female.   The history is provided by the patient.  Cough  Associated symptoms: headaches and sore throat   Associated symptoms: no wheezing   URI  Presenting symptoms: congestion, cough, facial pain and sore throat   Severity:  Moderate Onset quality:  Sudden Duration:  6 days Timing:  Constant Progression:  Worsening Chronicity:  New Associated symptoms: headaches and sinus pain   Associated symptoms: no wheezing   Risk factors: chronic respiratory disease (intermittent asthma)   Risk factors: not elderly, no chronic cardiac disease, no chronic kidney disease, no diabetes mellitus, no immunosuppression, no recent illness and no recent travel     Past Medical History:  Diagnosis Date  . Asthma    Bronchitis induced  . H/O seasonal allergies   . Sjogren's disease (HCC)     There are no active problems to display for this patient.   History reviewed. No pertinent surgical history.  OB History    No data available       Home Medications    Prior to Admission medications   Medication Sig Start Date End Date Taking? Authorizing Provider  fluticasone (FLONASE) 50 MCG/ACT nasal spray Place 1 spray into both nostrils daily. 09/25/16  Yes Renford DillsMiller, Lindsey, NP  ibuprofen (ADVIL,MOTRIN) 600 MG tablet Take 1 tablet (600 mg total) by mouth every 6 (six) hours as needed. 07/31/16  Yes Domenick GongMortenson, Ashley, MD  loratadine (CLARITIN) 10 MG tablet Take 10 mg by mouth daily.   Yes [provider]  albuterol (PROVENTIL HFA;VENTOLIN HFA) 108 (90 Base) MCG/ACT inhaler Inhale 2 puffs into the lungs every 4 (four) hours as needed for wheezing or shortness of breath. 04/12/15   Cuthriell, Delorise RoyalsJonathan D, PA-C  albuterol (PROVENTIL HFA;VENTOLIN HFA) 108 (90 Base)  MCG/ACT inhaler Inhale 2 puffs into the lungs every 4 (four) hours as needed for wheezing. 10/01/16   Renford DillsMiller, Lindsey, NP  amoxicillin (AMOXIL) 875 MG tablet Take 1 tablet (875 mg total) by mouth 2 (two) times daily. 11/06/16   Payton Mccallumonty, Kyndel Egger, MD  benzonatate (TESSALON) 100 MG capsule Take 1 capsule (100 mg total) by mouth 3 (three) times daily as needed. 11/06/16   Payton Mccallumonty, Jenaya Saar, MD  chlorpheniramine-HYDROcodone (TUSSIONEX PENNKINETIC ER) 10-8 MG/5ML SUER Take 5 mLs by mouth at bedtime as needed. do not drive or operate machinery while taking as can cause drowsiness. 09/25/16   Renford DillsMiller, Lindsey, NP  doxycycline (VIBRAMYCIN) 100 MG capsule Take 1 capsule (100 mg total) by mouth 2 (two) times daily. 10/01/16   Renford DillsMiller, Lindsey, NP  fluconazole (DIFLUCAN) 150 MG tablet Take 1 tablet (150 mg total) by mouth daily. Take one pill orally, then Repeat in one week as needed. 10/01/16   Renford DillsMiller, Lindsey, NP  fluticasone (FLONASE) 50 MCG/ACT nasal spray Place 2 sprays into both nostrils daily. 07/31/16   Domenick GongMortenson, Ashley, MD  meloxicam (MOBIC) 15 MG tablet Take 1 tablet (15 mg total) by mouth daily as needed. 09/13/16   Renford DillsMiller, Lindsey, NP  oxyCODONE-acetaminophen (ROXICET) 5-325 MG tablet Take 1 tablet by mouth every 8 (eight) hours as needed for moderate pain or severe pain (Do not drive or operate heavy machinery while taking as can cause drowsiness.). 09/13/16   Renford DillsMiller, Lindsey, NP  predniSONE (DELTASONE)  10 MG tablet Start 60 mg po day one, then 50 mg po day two, taper by 10 mg daily until complete. 10/01/16   Renford Dills, NP  tiZANidine (ZANAFLEX) 4 MG tablet Take 1 tablet (4 mg total) by mouth every 8 (eight) hours as needed for muscle spasms. 07/31/16   Domenick Gong, MD    Family History Family History  Problem Relation Age of Onset  . Heart failure Father   . Hypertension Father     Social History Social History  Substance Use Topics  . Smoking status: Current Every Day Smoker    Packs/day:  0.50    Types: Cigarettes  . Smokeless tobacco: Never Used  . Alcohol use No     Allergies   Biaxin [clarithromycin]; Codeine; Doxycycline; Levaquin [levofloxacin]; and Sulfa antibiotics   Review of Systems Review of Systems  HENT: Positive for congestion, sinus pain and sore throat.   Respiratory: Positive for cough. Negative for wheezing.   Neurological: Positive for headaches.     Physical Exam Triage Vital Signs ED Triage Vitals [11/06/16 1730]  Enc Vitals Group     BP      Pulse      Resp      Temp      Temp src      SpO2      Weight 220 lb (99.8 kg)     Height 5' 7.5" (1.715 m)     Head Circumference      Peak Flow      Pain Score 7     Pain Loc      Pain Edu?      Excl. in GC?    No data found.   Updated Vital Signs Ht 5' 7.5" (1.715 m)   Wt 220 lb (99.8 kg)   LMP 09/09/2016 (Approximate)   BMI 33.95 kg/m   Visual Acuity Right Eye Distance:   Left Eye Distance:   Bilateral Distance:    Right Eye Near:   Left Eye Near:    Bilateral Near:     Physical Exam  Constitutional: She appears well-developed and well-nourished. No distress.  HENT:  Head: Normocephalic and atraumatic.  Right Ear: Tympanic membrane, external ear and ear canal normal.  Left Ear: Tympanic membrane, external ear and ear canal normal.  Nose: Mucosal edema and rhinorrhea present. No nose lacerations, sinus tenderness, nasal deformity, septal deviation or nasal septal hematoma. No epistaxis.  No foreign bodies. Right sinus exhibits maxillary sinus tenderness and frontal sinus tenderness. Left sinus exhibits maxillary sinus tenderness and frontal sinus tenderness.  Mouth/Throat: Uvula is midline, oropharynx is clear and moist and mucous membranes are normal. No oropharyngeal exudate.  Eyes: Pupils are equal, round, and reactive to light. Conjunctivae and EOM are normal. Right eye exhibits no discharge. Left eye exhibits no discharge. No scleral icterus.  Neck: Normal range of  motion. Neck supple. No thyromegaly present.  Cardiovascular: Normal rate, regular rhythm and normal heart sounds.   Pulmonary/Chest: Effort normal and breath sounds normal. No respiratory distress. She has no wheezes. She has no rales.  Lymphadenopathy:    She has no cervical adenopathy.  Skin: She is not diaphoretic.  Nursing note and vitals reviewed.    UC Treatments / Results  Labs (all labs ordered are listed, but only abnormal results are displayed) Labs Reviewed - No data to display  EKG  EKG Interpretation None       Radiology No results found.  Procedures Procedures (including critical care  time)  Medications Ordered in UC Medications - No data to display   Initial Impression / Assessment and Plan / UC Course  I have reviewed the triage vital signs and the nursing notes.  Pertinent labs & imaging results that were available during my care of the patient were reviewed by me and considered in my medical decision making (see chart for details).       Final Clinical Impressions(s) / UC Diagnoses   Final diagnoses:  Acute frontal sinusitis, recurrence not specified    New Prescriptions Discharge Medication List as of 11/06/2016  5:52 PM    START taking these medications   Details  amoxicillin (AMOXIL) 875 MG tablet Take 1 tablet (875 mg total) by mouth 2 (two) times daily., Starting Wed 11/06/2016, Normal       1. diagnosis reviewed with patient 2. rx as per orders above; reviewed possible side effects, interactions, risks and benefits  3. Recommend supportive treatment with rest, fluids, otc analgesics   4. Follow-up prn if symptoms worsen or don't improve  Controlled Substance Prescriptions [place] Controlled Substance Registry consulted? Not Applicable   Payton Mccallum, MD 11/06/16 [phone]

## 2016-11-06 NOTE — ED Triage Notes (Signed)
Patient in today c/o cough, sinus congestion and pain x 3-4 days. Patient also states her face "hurts" and top of head "hurts". Patient has tried warm compresses and steam showers without relief.

## 2017-01-08 ENCOUNTER — Ambulatory Visit
Admission: EM | Admit: 2017-01-08 | Discharge: 2017-01-08 | Disposition: A | Discharge status: Home / Self Care | Payer: Medicare Other | Attending: Emergency Medicine | Admitting: Emergency Medicine

## 2017-01-08 ENCOUNTER — Other Ambulatory Visit: Payer: Self-pay

## 2017-01-08 DIAGNOSIS — R05 Cough: Secondary | ICD-10-CM

## 2017-01-08 DIAGNOSIS — J019 Acute sinusitis, unspecified: Secondary | ICD-10-CM

## 2017-01-08 DIAGNOSIS — R059 Cough, unspecified: Secondary | ICD-10-CM

## 2017-01-08 MED ORDER — AMOXICILLIN-POT CLAVULANATE 875-125 MG PO TABS: 1.0000 | ORAL_TABLET | Freq: Two times a day (BID) | ORAL | 0 refills | Status: DC

## 2017-01-08 MED ORDER — BENZONATATE 200 MG PO CAPS: 200.0000 mg | ORAL_CAPSULE | Freq: Three times a day (TID) | ORAL | 0 refills | Status: DC | PRN

## 2017-01-08 MED ORDER — ALBUTEROL SULFATE HFA 108 (90 BASE) MCG/ACT IN AERS: 1.0000 | INHALATION_SPRAY | Freq: Four times a day (QID) | RESPIRATORY_TRACT | 0 refills | Status: DC | PRN

## 2017-01-08 MED ORDER — AEROCHAMBER PLUS MISC: 2 refills | Status: DC

## 2017-01-08 MED ORDER — HYDROCOD POLST-CPM POLST ER 10-8 MG/5ML PO SUER: 5.0000 mL | Freq: Two times a day (BID) | ORAL | 0 refills | Status: DC | PRN

## 2017-01-08 MED ORDER — FLUTICASONE PROPIONATE 50 MCG/ACT NA SUSP: 2.0000 | Freq: Every day | NASAL | 0 refills | Status: DC

## 2017-01-08 NOTE — ED Triage Notes (Signed)
Patient complains of cough and shortness of breath with bilateral rib pain. Patient states that symptoms have been on and off x 3 weeks.

## 2017-01-08 NOTE — Discharge Instructions (Signed)
Augmentin to cover the sinus infection, Flonase, saline nasal irrigation, Tussionex to help you sleep at night.  1-2 puffs from your albuterol inhaler using a spacer every 4-6 hours as needed for shortness of breath.  continue the Claritin. [facility] if she has persistent shortness of breath with exertion after using the albuterol on a regular basis.

## 2017-01-08 NOTE — ED Provider Notes (Signed)
HPI  SUBJECTIVE:  Karen Reed is a 56 y.o. female who presents with cough for the past month.  States it is getting worse for the past week and a half.  She reports yellowish brownish nasal congestion, rhinorrhea, postnasal drip, maxillary sinus pain and pressure.  She states that her face feels swollen for the past 2 weeks.  She reports diffuse chest soreness with cough only.  She reports wheezing at night which resolves after coughing.  Some shortness of breath with heavy exertion.  DOE 15 stairs.  States this is new over the past several weeks.  No upper dental pain.  No lower extremity edema, unintentional weight gain, orthopnea, PND, abdominal pain.  Questionable nocturia.  States that she is unable to sleep at night secondary to the cough.  No fevers, other chest pain.  She tried ibuprofen 400 mg every 4 hours, Claritin, Robitussin-DM.  Steam and Claritin seem to help.'s symptoms are worse with bending forward.  She denies GERD symptoms.  She is a smoker.  She has a past medical history of bronchitis and does not have an inhaler anymore, states that she ran out.  No history of asthma emphysema, COPD, hypertension, CHF, diabetes, GERD, MI, DVT, PE, cancer.  QIO:[id number], [employer]     Past Medical History:  Diagnosis Date  . Asthma    Bronchitis induced  . H/O seasonal allergies   . Sjogren's disease [facility])     Past Surgical History:  Procedure Laterality Date  . NO PAST SURGERIES      Family History  Problem Relation Age of Onset  . Heart failure Father   . Hypertension Father     Social History   Tobacco Use  . Smoking status: Current Every Day Smoker    Packs/day: 0.50    Types: Cigarettes  . Smokeless tobacco: Never Used  Substance Use Topics  . Alcohol use: No  . Drug use: No    No current facility-administered medications for this encounter.   Current Outpatient Medications:  .  ibuprofen (ADVIL,MOTRIN) 600 MG tablet, Take 1 tablet (600 mg total) by mouth  every 6 (six) hours as needed., Disp: 30 tablet, Rfl: 0 .  loratadine (CLARITIN) 10 MG tablet, Take 10 mg by mouth daily., Disp: , Rfl:  .  albuterol (PROVENTIL HFA;VENTOLIN HFA) 108 (90 Base) MCG/ACT inhaler, Inhale 1-2 puffs into the lungs every 6 (six) hours as needed for wheezing or shortness of breath., Disp: 1 Inhaler, Rfl: 0 .  amoxicillin-clavulanate (AUGMENTIN) 875-125 MG tablet, Take 1 tablet by mouth 2 (two) times daily. X 7 days, Disp: 14 tablet, Rfl: 0 .  benzonatate (TESSALON) 200 MG capsule, Take 1 capsule (200 mg total) by mouth 3 (three) times daily as needed for cough., Disp: 30 capsule, Rfl: 0 .  chlorpheniramine-HYDROcodone (TUSSIONEX PENNKINETIC ER) 10-8 MG/5ML SUER, Take 5 mLs by mouth every 12 (twelve) hours as needed for cough., Disp: 120 mL, Rfl: 0 .  fluticasone (FLONASE) 50 MCG/ACT nasal spray, Place 2 sprays into both nostrils daily., Disp: 16 g, Rfl: 0 .  Spacer/Aero-Holding Chambers (AEROCHAMBER PLUS) inhaler, Use as instructed, Disp: 1 each, Rfl: 2  Allergies  Allergen Reactions  . Biaxin [Clarithromycin] Other (See Comments)  . Codeine Hives  . Doxycycline Other (See Comments)    Weak and tingling in face  . Levaquin [Levofloxacin] Other (See Comments)    Joint Pain  . Sulfa Antibiotics Hives and Swelling     ROS  As noted in HPI.  Physical Exam  BP (!) 151/93 (BP Location: Left Arm)   Pulse 74   Temp 98.6 F (37 C) (Oral)   Resp 17   Ht 5' 7.5" (1.715 m)   Wt 220 lb (99.8 kg)   SpO2 99%   BMI 33.95 kg/m   Constitutional: Well developed, well nourished, no acute distress Eyes:  EOMI, conjunctiva normal bilaterally HENT: Normocephalic, atraumatic,mucus membranes moist.  Positive purulent nasal congestion.  No maxillary or frontal sinus tenderness.  Normal oropharynx.  Positive postnasal drip. Respiratory: Normal inspiratory effort, lungs clear bilaterally, good air movement.  No chest wall tenderness  cardiovascular: Normal rate regular  rhythm, no murmurs, rubs, gallops GI: nondistended skin: No rash, skin intact Musculoskeletal: Calves symmetric, nontender, no edema no deformities Neurologic: Alert & oriented x 3, no focal neuro deficits Psychiatric: Speech and behavior appropriate   ED Course   Medications - No data to display  No orders of the defined types were placed in this encounter.   No results found for this or any previous visit (from the past 24 hour(s)). No results found.  ED Clinical Impression  Cough  Acute non-recurrent sinusitis, unspecified location   ED Assessment/Plan  [place] Narcotic database reviewed for this patient, and feel that the risk/benefit ratio today is favorable for proceeding with a prescription for controlled substance.  Presentation consistent with a sinusitis.  Feel that the cough is from the postnasal drip.  She does describe some shortness of breath with heavy exertion, but she does have a history of bronchitis and is a smoker.  States that she does not have an inhaler at home.  Will send home with Augmentin to cover the sinus infection, Flonase, saline nasal irrigation, Tussionex to help her sleep at night.  1-2 puffs from her albuterol inhaler using a spacer every 4-6 hours as needed for shortness of breath.  She is to continue the Claritin.  She will [facility] if she has persistent shortness of breath with exertion after using the albuterol on a regular basis to rule out CHF.  She does not appear fluid overloaded here.   Discussed  imaging, MDM, plan and followup with patient. Discussed sn/sx that should prompt return to the ED. patient agrees with plan.   Meds ordered this encounter  Medications  . fluticasone (FLONASE) 50 MCG/ACT nasal spray    Sig: Place 2 sprays into both nostrils daily.    Dispense:  16 g    Refill:  0  . albuterol (PROVENTIL HFA;VENTOLIN HFA) 108 (90 Base) MCG/ACT inhaler    Sig: Inhale 1-2  puffs into the lungs every 6 (six) hours as needed for wheezing or shortness of breath.    Dispense:  1 Inhaler    Refill:  0  . Spacer/Aero-Holding Chambers (AEROCHAMBER PLUS) inhaler    Sig: Use as instructed    Dispense:  1 each    Refill:  2  . benzonatate (TESSALON) 200 MG capsule    Sig: Take 1 capsule (200 mg total) by mouth 3 (three) times daily as needed for cough.    Dispense:  30 capsule    Refill:  0  . chlorpheniramine-HYDROcodone (TUSSIONEX PENNKINETIC ER) 10-8 MG/5ML SUER    Sig: Take 5 mLs by mouth every 12 (twelve) hours as needed for cough.    Dispense:  120 mL    Refill:  0  . amoxicillin-clavulanate (AUGMENTIN) 875-125 MG tablet    Sig: Take 1 tablet  by mouth 2 (two) times daily. X 7 days    Dispense:  14 tablet    Refill:  0    *This clinic note was created using Scientist, clinical (histocompatibility and immunogenetics)Dragon dictation software. Therefore, there may be occasional mistakes despite careful proofreading.   ?   Domenick GongMortenson, Kiet Geer, MD 01/08/17 [phone]

## 2017-01-27 ENCOUNTER — Ambulatory Visit: Payer: Medicare Other

## 2017-01-27 ENCOUNTER — Ambulatory Visit
Admission: EM | Admit: 2017-01-27 | Discharge: 2017-01-27 | Disposition: A | Discharge status: Home / Self Care | Payer: Medicare Other | Attending: Family Medicine | Admitting: Family Medicine

## 2017-01-27 ENCOUNTER — Encounter: Payer: Self-pay | Admitting: *Deleted

## 2017-01-27 DIAGNOSIS — F17210 Nicotine dependence, cigarettes, uncomplicated: Secondary | ICD-10-CM | POA: Diagnosis not present

## 2017-01-27 DIAGNOSIS — R059 Cough, unspecified: Secondary | ICD-10-CM

## 2017-01-27 DIAGNOSIS — J0101 Acute recurrent maxillary sinusitis: Secondary | ICD-10-CM

## 2017-01-27 DIAGNOSIS — J45909 Unspecified asthma, uncomplicated: Secondary | ICD-10-CM | POA: Insufficient documentation

## 2017-01-27 DIAGNOSIS — R05 Cough: Secondary | ICD-10-CM | POA: Diagnosis not present

## 2017-01-27 DIAGNOSIS — J40 Bronchitis, not specified as acute or chronic: Secondary | ICD-10-CM | POA: Diagnosis not present

## 2017-01-27 DIAGNOSIS — M3500 Sicca syndrome, unspecified: Secondary | ICD-10-CM | POA: Insufficient documentation

## 2017-01-27 DIAGNOSIS — J209 Acute bronchitis, unspecified: Secondary | ICD-10-CM

## 2017-01-27 MED ORDER — BENZONATATE 100 MG PO CAPS: 100.0000 mg | ORAL_CAPSULE | Freq: Three times a day (TID) | ORAL | 0 refills | Status: DC | PRN

## 2017-01-27 MED ORDER — PREDNISONE 10 MG PO TABS: ORAL_TABLET | ORAL | 0 refills | Status: DC

## 2017-01-27 MED ORDER — CEFDINIR 300 MG PO CAPS: 300.0000 mg | ORAL_CAPSULE | Freq: Two times a day (BID) | ORAL | 0 refills | Status: DC

## 2017-01-27 MED ORDER — HYDROCOD POLST-CPM POLST ER 10-8 MG/5ML PO SUER: 5.0000 mL | Freq: Every evening | ORAL | 0 refills | Status: DC | PRN

## 2017-01-27 NOTE — ED Provider Notes (Signed)
[facility] ____________________________________________  Time seen: Approximately 7:38 PM  I have reviewed the triage vital signs and the nursing notes.   HISTORY  Chief Complaint Cough   HPI Karen Reed is a 57 y.o. female presenting for evaluation of cough, nasal congestion, sinus pressure, postnasal drainage and chest congestion that is been present for overall 1 month.  Patient reports that she was seen in urgent care approximately 2 weeks ago for the same complaint, at that point was treated with Augmentin for sinusitis.  States that while she was on the antibiotic her symptoms were improving and near resolution, however reports symptoms never fully resolved and worsened over the last few days.  States cough is chief complaint.  States cough is sometimes productive, but overall a nonproductive hacking cough.  States that she does intermittently hear herself wheeze with cough.  States that she has been taking her daily antihistamine, Flonase and occasional albuterol use.  States she completed the cough medication from last visit.  States was not given prednisone.  Reports in the past prednisone did help with her spasming cough.  Denies associated chest pain, shortness of breath or hemoptysis.  Denies accompanying fevers.  Reports his continue remain active.  Denies recent trips, sedentary changes, paresthesias, extremity swelling.  Reports continues to eat and drink well.  Denies home sick contacts.  Current smoker, continues.  No previous pulmonology evaluation.  Chronic seasonal allergies as well as suspected mold allergy.  States that she has a primary care in [place], but due to transportation issues she has not followed up with them.  States considering establishing a new primary local to [place].      Past Medical History:  Diagnosis Date  . Asthma    Bronchitis induced  . H/O seasonal allergies   . Sjogren's disease (HCC)     There are no active problems  to display for this patient.   Past Surgical History:  Procedure Laterality Date  . NO PAST SURGERIES       No current facility-administered medications for this encounter.   Current Outpatient Medications:  .  albuterol (PROVENTIL HFA;VENTOLIN HFA) 108 (90 Base) MCG/ACT inhaler, Inhale 1-2 puffs into the lungs every 6 (six) hours as needed for wheezing or shortness of breath., Disp: 1 Inhaler, Rfl: 0 .  fluticasone (FLONASE) 50 MCG/ACT nasal spray, Place 2 sprays into both nostrils daily., Disp: 16 g, Rfl: 0 .  loratadine (CLARITIN) 10 MG tablet, Take 10 mg by mouth daily., Disp: , Rfl:  .  Spacer/Aero-Holding Chambers (AEROCHAMBER PLUS) inhaler, Use as instructed, Disp: 1 each, Rfl: 2 .  benzonatate (TESSALON PERLES) 100 MG capsule, Take 1 capsule (100 mg total) by mouth 3 (three) times daily as needed for cough., Disp: 15 capsule, Rfl: 0 .  cefdinir (OMNICEF) 300 MG capsule, Take 1 capsule (300 mg total) by mouth 2 (two) times daily., Disp: 20 capsule, Rfl: 0 .  chlorpheniramine-HYDROcodone (TUSSIONEX PENNKINETIC ER) 10-8 MG/5ML SUER, Take 5 mLs by mouth at bedtime as needed for cough. do not drive or operate machinery while taking as can cause drowsiness., Disp: 60 mL, Rfl: 0 .  predniSONE (DELTASONE) 10 MG tablet, Start 60 mg po day one, then 50 mg po day two, taper by 10 mg daily until complete., Disp: 21 tablet, Rfl: 0  Allergies Biaxin [clarithromycin]; Codeine; Doxycycline; Levaquin [levofloxacin]; and Sulfa antibiotics   Reports allergic to multiple antibiotics.  States previously tolerated doxycycline well, but reports last year she started to  have some increased fatigue and tingling with doxycycline, and unsure if it was medication allergy but was told to stop.  Family History  Problem Relation Age of Onset  . Heart failure Father   . Hypertension Father     Social History Social History   Tobacco Use  . Smoking status: Current Every Day Smoker    Packs/day: 0.50     Types: Cigarettes  . Smokeless tobacco: Never Used  Substance Use Topics  . Alcohol use: No  . Drug use: No    Review of Systems Constitutional: No fever/chills Eyes: No visual changes. ENT: Occasional intermittent sore throat. Cardiovascular: Denies chest pain. Respiratory: Denies shortness of breath. Gastrointestinal: No abdominal pain.  No nausea, no vomiting.   Genitourinary: Negative for dysuria. Musculoskeletal: Negative for back pain. Skin: Negative for rash.   ____________________________________________   PHYSICAL EXAM:  VITAL SIGNS: ED Triage Vitals  Enc Vitals Group     BP 01/27/17 1800 (!) 144/86     Pulse Rate 01/27/17 1800 82     Resp 01/27/17 1800 16     Temp 01/27/17 1800 99 F (37.2 C)     Temp Source 01/27/17 1800 Oral     SpO2 01/27/17 1800 99 %     Weight 01/27/17 1801 220 lb (99.8 kg)     Height 01/27/17 1801 5\' 8"  (1.727 m)     Head Circumference --      Peak Flow --      Pain Score --      Pain Loc --      Pain Edu? --      Excl. in GC? --     Constitutional: Alert and oriented. Well appearing and in no acute distress. Eyes: Conjunctivae are normal.  Head: Atraumatic.Mild tenderness to palpation bilateral frontal and mild to moderate maxillary sinuses. No swelling. No erythema.   Ears: no erythema, normal TMs bilaterally.   Nose: nasal congestion with bilateral nasal turbinate erythema and edema.   Mouth/Throat: Mucous membranes are moist.  Oropharynx non-erythematous.No tonsillar swelling or exudate.  Neck: No stridor.  No cervical spine tenderness to palpation. Hematological/Lymphatic/Immunilogical: No cervical lymphadenopathy. Cardiovascular: Normal rate, regular rhythm. Grossly normal heart sounds.  Good peripheral circulation. Respiratory: Normal respiratory effort.  No retractions.mild scattered rhonchi.  No wheezes or rales.  Good air movement.  Dry intermittent cough noted in room with bronchospasm.  Speaks in complete  sentences. Gastrointestinal: Soft and nontender.  Musculoskeletal: No lower extremity tenderness nor edema.  Bilateral pedal pulses equal and easily palpated.  Neurologic:  Normal speech and language. No gross focal neurologic deficits are appreciated. No gait instability. Skin:  Skin is warm, dry and intact. No rash noted. Psychiatric: Mood and affect are normal. Speech and behavior are normal.  ___________________________________________   LABS (all labs ordered are listed, but only abnormal results are displayed)  Labs Reviewed - No data to display   RADIOLOGY  Dg Chest 2 View  Result Date: 01/27/2017 CLINICAL DATA:  Coughing and congestion EXAM: CHEST  2 VIEW COMPARISON:  04/18/2016 FINDINGS: The heart size and mediastinal contours are within normal limits. Both lungs are clear. The visualized skeletal structures are unremarkable. IMPRESSION: No active cardiopulmonary disease. Electronically Signed   By: Signa Kell M.D.   On: 01/27/2017 19:31    ____________________________________________   PROCEDURES Procedures   INITIAL IMPRESSION / ASSESSMENT AND PLAN / ED COURSE  Pertinent labs & imaging results that were available during my care of the patient were reviewed  by me and considered in my medical decision making (see chart for details).  Well-appearing patient.  No acute distress.  Patient with recurrent sinusitis and chronic allergies, concern for recurrent sinusitis.  As with continued cough, will evaluate chest x-ray.  Chest x-ray results per radiologist as above.  Called and discussed and reviewed chest x-ray with radiologist due to initial concern on lateral view of possible pulmonary nodule, radiologist felt costojunction.  Discussed this still in detail with patient and recommend follow-up. Information for local PCP given as well as ENT.  Also discussed further follow-up with pulmonology due to recurrence of symptoms.  Will treat patient with oral Omnicef, appearing  Tessalon Perles, as needed Tussionex, prednisone taper and continue home albuterol as needed.  Encourage rest, fluids, supportive care, discussed strict follow-up and return parameters. Discussed indication, risks and benefits of medications with patient.   Discussed follow up and return parameters including no resolution or any worsening concerns. Patient verbalized understanding and agreed to plan.   ____________________________________________   FINAL CLINICAL IMPRESSION(S) / ED DIAGNOSES  Final diagnoses:  Acute recurrent maxillary sinusitis  Bronchitis  Cough     ED Discharge Orders        Ordered    predniSONE (DELTASONE) 10 MG tablet     01/27/17 1951    cefdinir (OMNICEF) 300 MG capsule  2 times daily     01/27/17 1951    chlorpheniramine-HYDROcodone (TUSSIONEX PENNKINETIC ER) 10-8 MG/5ML SUER  At bedtime PRN     01/27/17 1951    benzonatate (TESSALON PERLES) 100 MG capsule  3 times daily PRN     01/27/17 1951       Note: This dictation was prepared with Dragon dictation along with smaller phrase technology. Any transcriptional errors that result from this process are unintentional.         Renford DillsMiller, Tasman Zapata, NP 01/27/17 2017

## 2017-01-27 NOTE — ED Triage Notes (Signed)
Seen here 2 weeks ago for URI symptoms, rx an antibiotic which gave her relief but when she finished the rx the cough returned and is now worse.

## 2017-01-27 NOTE — Discharge Instructions (Signed)
Take medication as prescribed. Rest. Drink plenty of fluids. Continue home albuterol.   Follow up with your primary care physician this week.Return to Urgent care for new or worsening concerns.

## 2017-01-30 ENCOUNTER — Telehealth: Payer: Self-pay | Admitting: Emergency Medicine

## 2017-01-30 NOTE — Telephone Encounter (Signed)
Called to follow up from patient's recent visit. Patient states she is feeling better.

## 2017-03-22 ENCOUNTER — Ambulatory Visit
Admission: EM | Admit: 2017-03-22 | Discharge: 2017-03-22 | Disposition: A | Discharge status: Home / Self Care | Payer: Medicare Other | Attending: Family Medicine | Admitting: Family Medicine

## 2017-03-22 ENCOUNTER — Other Ambulatory Visit: Payer: Self-pay

## 2017-03-22 DIAGNOSIS — J209 Acute bronchitis, unspecified: Secondary | ICD-10-CM

## 2017-03-22 MED ORDER — AZITHROMYCIN 250 MG PO TABS: ORAL_TABLET | ORAL | 0 refills | Status: DC

## 2017-03-22 MED ORDER — ALBUTEROL SULFATE HFA 108 (90 BASE) MCG/ACT IN AERS: 1.0000 | INHALATION_SPRAY | Freq: Four times a day (QID) | RESPIRATORY_TRACT | 0 refills | Status: DC | PRN

## 2017-03-22 MED ORDER — PREDNISONE 50 MG PO TABS: ORAL_TABLET | ORAL | 0 refills | Status: DC

## 2017-03-22 MED ORDER — BENZONATATE 100 MG PO CAPS: 100.0000 mg | ORAL_CAPSULE | Freq: Three times a day (TID) | ORAL | 0 refills | Status: DC | PRN

## 2017-03-22 NOTE — ED Provider Notes (Signed)
[facility]  CSN: [id number] Arrival date & time: 03/22/17  1430  History   Chief Complaint Chief Complaint  Patient presents with  . Cough   HPI  57 year old female presents with cough.  Patient has known asthma.  She states that she has been sick for the past week.  She has had cough, congestion, postnasal drip, wheezing, and shortness of breath.  She reports chills.  No documented fever.  She is taken Motrin without significant improvement.  She been using her inhaler which she is now out of.  Symptoms are worse at night.  No known relieving factors.  No other associated symptoms.  No other complaints.  Past Medical History:  Diagnosis Date  . Asthma    Bronchitis induced  . H/O seasonal allergies   . Sjogren's disease [facility](HCC)    Past Surgical History:  Procedure Laterality Date  . NO PAST SURGERIES     OB History    No data available     Home Medications    Prior to Admission medications   Medication Sig Start Date End Date Taking? Authorizing Provider  fluticasone (FLONASE) 50 MCG/ACT nasal spray Place 2 sprays into both nostrils daily. 01/08/17  Yes Domenick GongMortenson, Ashley, MD  albuterol (PROVENTIL HFA;VENTOLIN HFA) 108 (90 Base) MCG/ACT inhaler Inhale 1-2 puffs into the lungs every 6 (six) hours as needed for wheezing or shortness of breath. 03/22/17   Tommie Samsook, Avyn Coate G, DO  azithromycin (ZITHROMAX) 250 MG tablet 2 tablets on day 1, then 1 tablet daily on days 2-5. 03/22/17   Tommie Samsook, Maliq Pilley G, DO  benzonatate (TESSALON) 100 MG capsule Take 1 capsule (100 mg total) by mouth 3 (three) times daily as needed. 03/22/17   Tommie Samsook, Lorenzo Arscott G, DO  predniSONE (DELTASONE) 50 MG tablet 1 tablet daily x 5 days. 03/22/17   Tommie Samsook, Casee Knepp G, DO    Family History Family History  Problem Relation Age of Onset  . Heart failure Father   . Hypertension Father     Social History Social History   Tobacco Use  . Smoking status: Current Every Day Smoker    Packs/day: 0.50    Types: Cigarettes  .  Smokeless tobacco: Never Used  Substance Use Topics  . Alcohol use: No  . Drug use: No     Allergies   Biaxin [clarithromycin]; Codeine; Doxycycline; Levaquin [levofloxacin]; and Sulfa antibiotics   Review of Systems Review of Systems  Constitutional: Positive for chills.  HENT: Positive for congestion and postnasal drip.   Respiratory: Positive for cough, shortness of breath and wheezing.    Physical Exam Triage Vital Signs ED Triage Vitals  Enc Vitals Group     BP 03/22/17 1503 (!) 149/86     Pulse Rate 03/22/17 1503 78     Resp --      Temp 03/22/17 1503 98.2 F (36.8 C)     Temp Source 03/22/17 1503 Oral     SpO2 03/22/17 1503 99 %     Weight 03/22/17 1501 215 lb (97.5 kg)     Height 03/22/17 1501 5\' 8"  (1.727 m)     Head Circumference --      Peak Flow --      Pain Score 03/22/17 1501 0     Pain Loc --      Pain Edu? --      Excl. in GC? --    Updated Vital Signs BP (!) 149/86 (BP Location: Left Arm)   Pulse 78   Temp  98.2 F (36.8 C) (Oral)   Ht 5\' 8"  (1.727 m)   Wt 215 lb (97.5 kg)   SpO2 99%   BMI 32.69 kg/m   Physical Exam  Constitutional: She is oriented to person, place, and time. She appears well-developed. No distress.  HENT:  Head: Normocephalic and atraumatic.  Mouth/Throat: Oropharynx is clear and moist.  Cardiovascular: Normal rate and regular rhythm.  Pulmonary/Chest: Effort normal and breath sounds normal. She has no wheezes. She has no rales.  Neurological: She is alert and oriented to person, place, and time.  Psychiatric: She has a normal mood and affect. Her behavior is normal.  Nursing note and vitals reviewed.  UC Treatments / Results  Labs (all labs ordered are listed, but only abnormal results are displayed) Labs Reviewed - No data to display  EKG  EKG Interpretation None       Radiology No results found.  Procedures Procedures (including critical care time)  Medications Ordered in UC Medications - No data to  display   Initial Impression / Assessment and Plan / UC Course  I have reviewed the triage vital signs and the nursing notes.  Pertinent labs & imaging results that were available during my care of the patient were reviewed by me and considered in my medical decision making (see chart for details).     57 year old female presents with bronchitis.  Given duration of symptoms, and lack of improvement as well as given her history of asthma I am treating her with albuterol, prednisone, azithromycin.  Tessalon Perles for cough.  Final Clinical Impressions(s) / UC Diagnoses   Final diagnoses:  Acute bronchitis, unspecified organism    ED Discharge Orders        Ordered    albuterol (PROVENTIL HFA;VENTOLIN HFA) 108 (90 Base) MCG/ACT inhaler  Every 6 hours PRN     03/22/17 1536    predniSONE (DELTASONE) 50 MG tablet     03/22/17 1536    azithromycin (ZITHROMAX) 250 MG tablet     03/22/17 1536    benzonatate (TESSALON) 100 MG capsule  3 times daily PRN     03/22/17 1536     Controlled Substance Prescriptions [place] Controlled Substance Registry consulted? Not Applicable   Tommie Sams, DO 03/22/17 1601

## 2017-03-22 NOTE — ED Triage Notes (Signed)
Patient is c/o cough, nasal congestion, SOB and chest congestion x 6 days.

## 2017-05-23 ENCOUNTER — Other Ambulatory Visit: Payer: Self-pay

## 2017-05-23 ENCOUNTER — Ambulatory Visit
Admission: EM | Admit: 2017-05-23 | Discharge: 2017-05-23 | Disposition: A | Discharge status: Home / Self Care | Payer: Medicare Other | Attending: Family Medicine | Admitting: Family Medicine

## 2017-05-23 DIAGNOSIS — R079 Chest pain, unspecified: Secondary | ICD-10-CM | POA: Diagnosis not present

## 2017-05-23 DIAGNOSIS — R7989 Other specified abnormal findings of blood chemistry: Secondary | ICD-10-CM | POA: Diagnosis not present

## 2017-05-23 DIAGNOSIS — R0789 Other chest pain: Secondary | ICD-10-CM | POA: Diagnosis not present

## 2017-05-23 DIAGNOSIS — Z79899 Other long term (current) drug therapy: Secondary | ICD-10-CM | POA: Diagnosis not present

## 2017-05-23 DIAGNOSIS — Z882 Allergy status to sulfonamides status: Secondary | ICD-10-CM | POA: Insufficient documentation

## 2017-05-23 DIAGNOSIS — E669 Obesity, unspecified: Secondary | ICD-10-CM

## 2017-05-23 DIAGNOSIS — G4700 Insomnia, unspecified: Secondary | ICD-10-CM | POA: Diagnosis not present

## 2017-05-23 DIAGNOSIS — Z7951 Long term (current) use of inhaled steroids: Secondary | ICD-10-CM | POA: Diagnosis not present

## 2017-05-23 DIAGNOSIS — Z7901 Long term (current) use of anticoagulants: Secondary | ICD-10-CM | POA: Diagnosis not present

## 2017-05-23 DIAGNOSIS — Z6836 Body mass index (BMI) 36.0-36.9, adult: Secondary | ICD-10-CM | POA: Diagnosis not present

## 2017-05-23 DIAGNOSIS — F17210 Nicotine dependence, cigarettes, uncomplicated: Secondary | ICD-10-CM | POA: Diagnosis not present

## 2017-05-23 DIAGNOSIS — Z881 Allergy status to other antibiotic agents status: Secondary | ICD-10-CM | POA: Diagnosis not present

## 2017-05-23 DIAGNOSIS — Z885 Allergy status to narcotic agent status: Secondary | ICD-10-CM | POA: Insufficient documentation

## 2017-05-23 DIAGNOSIS — R0600 Dyspnea, unspecified: Secondary | ICD-10-CM | POA: Diagnosis not present

## 2017-05-23 DIAGNOSIS — R918 Other nonspecific abnormal finding of lung field: Secondary | ICD-10-CM | POA: Diagnosis not present

## 2017-05-23 DIAGNOSIS — M3500 Sicca syndrome, unspecified: Secondary | ICD-10-CM | POA: Diagnosis not present

## 2017-05-23 DIAGNOSIS — N289 Disorder of kidney and ureter, unspecified: Secondary | ICD-10-CM | POA: Diagnosis not present

## 2017-05-23 DIAGNOSIS — F329 Major depressive disorder, single episode, unspecified: Secondary | ICD-10-CM | POA: Diagnosis not present

## 2017-05-23 DIAGNOSIS — I872 Venous insufficiency (chronic) (peripheral): Secondary | ICD-10-CM | POA: Diagnosis not present

## 2017-05-23 MED ORDER — NITROGLYCERIN 2 % TD OINT
0.5000 [in_us] | TOPICAL_OINTMENT | Freq: Once | TRANSDERMAL | Status: AC
  Administered 2017-05-23: 0.5 [in_us] via TOPICAL

## 2017-05-23 MED ORDER — NITROGLYCERIN 0.4 MG SL SUBL: 0.4000 mg | SUBLINGUAL_TABLET | SUBLINGUAL | Status: DC | PRN

## 2017-05-23 NOTE — Discharge Instructions (Signed)
Go to the ER for chest pain rule out.  Take care  Dr. Adriana Simas

## 2017-05-23 NOTE — ED Provider Notes (Signed)
[facility]    CSN: [id number] Arrival date & time: 05/23/17  1214  History   Chief Complaint Chief Complaint  Patient presents with  . Chest Pain   HPI  57 year old female presents with chest pain.  Patient reports that her chest pain started approximately 1.5 hours prior to arrival.  She states that the pain is in the center of her chest.  Described as "heavy".  Also described as achy and pressure.  She reports left jaw pain as well.  Left jaw pain is now subsided.  No associated diaphoresis or shortness of breath.  She states that it began when she was cooking breakfast.  No exertion.  She took 2 baby aspirin but has not had any improvement.  Her pain is currently 7/10 in severity.  She states it is worse when she walks.  No known relieving factors.  No other associated symptoms.  No other complaints.  Past Medical History:  Diagnosis Date  . Asthma    Bronchitis induced  . H/O seasonal allergies   . Sjogren's disease [facility])    Past Surgical History:  Procedure Laterality Date  . NO PAST SURGERIES     OB History   None    Home Medications    Prior to Admission medications   Medication Sig Start Date End Date Taking? Authorizing Provider  albuterol (PROVENTIL HFA;VENTOLIN HFA) 108 (90 Base) MCG/ACT inhaler Inhale 1-2 puffs into the lungs every 6 (six) hours as needed for wheezing or shortness of breath. 03/22/17   Tommie Sams, DO   Family History Family History  Problem Relation Age of Onset  . Heart failure Father   . Hypertension Father    Social History Social History   Tobacco Use  . Smoking status: Current Every Day Smoker    Packs/day: 0.50    Types: Cigarettes  . Smokeless tobacco: Never Used  Substance Use Topics  . Alcohol use: No  . Drug use: No   Allergies   Biaxin [clarithromycin]; Codeine; Doxycycline; Levaquin [levofloxacin]; and Sulfa antibiotics  Review of Systems Review of Systems  Constitutional: Negative.   Respiratory:  Negative for shortness of breath.   Cardiovascular: Positive for chest pain.   Physical Exam Triage Vital Signs ED Triage Vitals [05/23/17 1221]  Enc Vitals Group     BP (!) 158/80     Pulse Rate 80     Resp 18     Temp 97.6 F (36.4 C)     Temp Source Oral     SpO2 100 %     Weight 230 lb (104.3 kg)     Height  (1.727 m)     Head Circumference      Peak Flow      Pain Score 7     Pain Loc      Pain Edu?      Excl. in GC?    Updated Vital Signs BP (!) 158/80 (BP Location: Left Arm)   Pulse 80   Temp 97.6 F (36.4 C) (Oral)   Resp 18   Ht  (1.727 m)   Wt 230 lb (104.3 kg)   SpO2 100%   BMI 34.97 kg/m     Physical Exam  Constitutional: She is oriented to person, place, and time. She appears well-developed. No distress.  HENT:  Head: Normocephalic and atraumatic.  Cardiovascular: Normal rate and regular rhythm.  Pulmonary/Chest: Effort normal and breath sounds normal. She has no wheezes. She has no rales.  She exhibits tenderness.  Neurological: She is alert and oriented to person, place, and time.  Psychiatric: She has a normal mood and affect. Her behavior is normal.  Nursing note and vitals reviewed.  UC Treatments / Results  Labs (all labs ordered are listed, but only abnormal results are displayed) Labs Reviewed - No data to display  EKG Interpretation: Normal sinus rhythm with a rate of 76.  Normal axis.  No ST or T wave changes.  Normal intervals.  Normal EKG.  Radiology No results found.  Procedures Procedures (including critical care time)  Medications Ordered in UC Medications  nitroGLYCERIN (NITROGLYN) 2 % ointment 0.5 inch (0.5 inches Topical Given 05/23/17 1301)    Initial Impression / Assessment and Plan / UC Course  I have reviewed the triage vital signs and the nursing notes.  Pertinent labs & imaging results that were available during my care of the patient were reviewed by me and considered in my medical decision making (see  chart for details).    57 year old female presents with atypical chest pain.  Patient's risk factors include tobacco abuse, obesity, family history of cardiac disease (father with MI in his 57's).  She was given Nitropaste without improvement.  I advised her that given her risk factors, she should go to the ER for labs/rule out.  Patient is in agreement.  Stable for discharge.  She is going via [employer].  Final Clinical Impressions(s) / UC Diagnoses   Final diagnoses:  Atypical chest pain     Discharge Instructions     Go to the ER for chest pain rule out.  Take care  Dr. Adriana Simas     ED Prescriptions    None     Controlled Substance Prescriptions [place] Controlled Substance Registry consulted? Not Applicable   Tommie Sams, DO 05/23/17 1328

## 2017-05-23 NOTE — ED Notes (Signed)
Nitropaste to left chest d/c'd prior to discharge

## 2017-05-23 NOTE — ED Triage Notes (Signed)
Patient complains of heavy and dull chest pain x 1.5 hours with left jaw pain that has subsided. No cardiac history.

## 2017-05-24 DIAGNOSIS — R0789 Other chest pain: Secondary | ICD-10-CM | POA: Diagnosis not present

## 2017-05-24 DIAGNOSIS — Z720 Tobacco use: Secondary | ICD-10-CM | POA: Diagnosis not present

## 2017-05-24 DIAGNOSIS — R079 Chest pain, unspecified: Secondary | ICD-10-CM | POA: Diagnosis not present

## 2017-05-29 DIAGNOSIS — R931 Abnormal findings on diagnostic imaging of heart and coronary circulation: Secondary | ICD-10-CM | POA: Diagnosis not present

## 2017-05-29 DIAGNOSIS — R0789 Other chest pain: Secondary | ICD-10-CM | POA: Diagnosis not present

## 2017-06-02 DIAGNOSIS — R739 Hyperglycemia, unspecified: Secondary | ICD-10-CM | POA: Diagnosis not present

## 2017-06-02 DIAGNOSIS — Z09 Encounter for follow-up examination after completed treatment for conditions other than malignant neoplasm: Secondary | ICD-10-CM | POA: Diagnosis not present

## 2017-06-02 DIAGNOSIS — R29898 Other symptoms and signs involving the musculoskeletal system: Secondary | ICD-10-CM | POA: Diagnosis not present

## 2017-06-02 DIAGNOSIS — F320 Major depressive disorder, single episode, mild: Secondary | ICD-10-CM | POA: Diagnosis not present

## 2017-06-02 DIAGNOSIS — G4719 Other hypersomnia: Secondary | ICD-10-CM | POA: Diagnosis not present

## 2017-06-02 DIAGNOSIS — G4710 Hypersomnia, unspecified: Secondary | ICD-10-CM | POA: Diagnosis not present

## 2017-06-02 DIAGNOSIS — R5382 Chronic fatigue, unspecified: Secondary | ICD-10-CM | POA: Diagnosis not present

## 2017-06-02 DIAGNOSIS — M3501 Sicca syndrome with keratoconjunctivitis: Secondary | ICD-10-CM | POA: Diagnosis not present

## 2017-06-02 DIAGNOSIS — R079 Chest pain, unspecified: Secondary | ICD-10-CM | POA: Diagnosis not present

## 2017-06-02 DIAGNOSIS — R0683 Snoring: Secondary | ICD-10-CM | POA: Diagnosis not present

## 2017-06-02 DIAGNOSIS — E669 Obesity, unspecified: Secondary | ICD-10-CM | POA: Diagnosis not present

## 2017-06-02 DIAGNOSIS — F17200 Nicotine dependence, unspecified, uncomplicated: Secondary | ICD-10-CM | POA: Diagnosis not present

## 2017-06-11 DIAGNOSIS — R946 Abnormal results of thyroid function studies: Secondary | ICD-10-CM | POA: Diagnosis not present

## 2017-06-11 DIAGNOSIS — F17200 Nicotine dependence, unspecified, uncomplicated: Secondary | ICD-10-CM | POA: Diagnosis not present

## 2017-06-11 DIAGNOSIS — R7989 Other specified abnormal findings of blood chemistry: Secondary | ICD-10-CM | POA: Diagnosis not present

## 2017-06-11 DIAGNOSIS — F329 Major depressive disorder, single episode, unspecified: Secondary | ICD-10-CM | POA: Diagnosis not present

## 2017-06-11 DIAGNOSIS — Z0000 Encounter for general adult medical examination without abnormal findings: Secondary | ICD-10-CM | POA: Diagnosis not present

## 2017-06-11 DIAGNOSIS — I10 Essential (primary) hypertension: Secondary | ICD-10-CM | POA: Diagnosis not present

## 2017-06-11 DIAGNOSIS — R079 Chest pain, unspecified: Secondary | ICD-10-CM | POA: Diagnosis not present

## 2017-06-11 DIAGNOSIS — E669 Obesity, unspecified: Secondary | ICD-10-CM | POA: Diagnosis not present

## 2017-06-11 DIAGNOSIS — M3501 Sicca syndrome with keratoconjunctivitis: Secondary | ICD-10-CM | POA: Insufficient documentation

## 2017-06-11 DIAGNOSIS — R0683 Snoring: Secondary | ICD-10-CM | POA: Diagnosis not present

## 2017-06-18 DIAGNOSIS — R079 Chest pain, unspecified: Secondary | ICD-10-CM | POA: Diagnosis not present

## 2017-06-18 DIAGNOSIS — E669 Obesity, unspecified: Secondary | ICD-10-CM | POA: Diagnosis not present

## 2017-06-18 DIAGNOSIS — Z7289 Other problems related to lifestyle: Secondary | ICD-10-CM | POA: Diagnosis not present

## 2017-06-18 DIAGNOSIS — F329 Major depressive disorder, single episode, unspecified: Secondary | ICD-10-CM | POA: Diagnosis not present

## 2017-06-18 DIAGNOSIS — R7303 Prediabetes: Secondary | ICD-10-CM | POA: Insufficient documentation

## 2017-06-18 DIAGNOSIS — I10 Essential (primary) hypertension: Secondary | ICD-10-CM | POA: Diagnosis not present

## 2017-06-27 DIAGNOSIS — E669 Obesity, unspecified: Secondary | ICD-10-CM | POA: Diagnosis not present

## 2017-06-27 DIAGNOSIS — Z6835 Body mass index (BMI) 35.0-35.9, adult: Secondary | ICD-10-CM | POA: Diagnosis not present

## 2017-06-27 DIAGNOSIS — I10 Essential (primary) hypertension: Secondary | ICD-10-CM | POA: Diagnosis not present

## 2017-06-27 DIAGNOSIS — R079 Chest pain, unspecified: Secondary | ICD-10-CM | POA: Diagnosis not present

## 2017-06-27 DIAGNOSIS — Z720 Tobacco use: Secondary | ICD-10-CM | POA: Diagnosis not present

## 2017-07-03 DIAGNOSIS — M3501 Sicca syndrome with keratoconjunctivitis: Secondary | ICD-10-CM | POA: Diagnosis not present

## 2017-07-03 DIAGNOSIS — M2550 Pain in unspecified joint: Secondary | ICD-10-CM | POA: Insufficient documentation

## 2017-07-03 DIAGNOSIS — R600 Localized edema: Secondary | ICD-10-CM | POA: Insufficient documentation

## 2017-07-03 DIAGNOSIS — M170 Bilateral primary osteoarthritis of knee: Secondary | ICD-10-CM | POA: Diagnosis not present

## 2017-07-03 DIAGNOSIS — R609 Edema, unspecified: Secondary | ICD-10-CM | POA: Diagnosis not present

## 2017-07-03 DIAGNOSIS — J9811 Atelectasis: Secondary | ICD-10-CM | POA: Diagnosis not present

## 2017-07-03 DIAGNOSIS — F17200 Nicotine dependence, unspecified, uncomplicated: Secondary | ICD-10-CM | POA: Diagnosis not present

## 2017-07-07 ENCOUNTER — Other Ambulatory Visit: Payer: Self-pay | Admitting: Internal Medicine

## 2017-07-07 DIAGNOSIS — R609 Edema, unspecified: Secondary | ICD-10-CM

## 2017-07-07 DIAGNOSIS — M3501 Sicca syndrome with keratoconjunctivitis: Secondary | ICD-10-CM

## 2017-07-07 DIAGNOSIS — R600 Localized edema: Secondary | ICD-10-CM

## 2017-07-16 ENCOUNTER — Ambulatory Visit: Admission: RE | Admit: 2017-07-16 | Payer: Medicare HMO | Source: Ambulatory Visit

## 2017-07-16 ENCOUNTER — Ambulatory Visit: Payer: Medicare Other

## 2017-07-18 ENCOUNTER — Ambulatory Visit
Admission: EM | Admit: 2017-07-18 | Discharge: 2017-07-18 | Disposition: A | Discharge status: Home / Self Care | Payer: Medicare HMO | Attending: Family Medicine | Admitting: Family Medicine

## 2017-07-18 ENCOUNTER — Encounter: Payer: Self-pay | Admitting: Emergency Medicine

## 2017-07-18 DIAGNOSIS — J0100 Acute maxillary sinusitis, unspecified: Secondary | ICD-10-CM

## 2017-07-18 MED ORDER — AMOXICILLIN-POT CLAVULANATE 875-125 MG PO TABS: 1.0000 | ORAL_TABLET | Freq: Two times a day (BID) | ORAL | 0 refills | Status: DC

## 2017-07-18 NOTE — ED Provider Notes (Signed)
[facility] ____________________________________________  Time seen: Approximately 1:37 PM  I have reviewed the triage vital signs and the nursing notes.   HISTORY  Chief Complaint Nasal Congestion   HPI Karen Reed is a 57 y.o. female presenting for evaluation of 2 weeks of runny nose, nasal congestion, postnasal drainage and sinus pressure.  Patient reports that she has a history of sinus infection with similar presentation.  States she feels that she may have picked up a virus or her allergies that triggered this initially.  States that she is unable to get a lot of drainage out with blowing her nose, but states she can feel it running down the back of her throat.  States symptoms are worse at night with some cough.  Denies known fevers.  Denies chest pain or shortness of breath.  Continues to eat and drink well.  Has only taken occasional Claritin and aspirin for symptoms.  No other over-the-counter medications taken for the same complaints.  Patient also reports during the same timeframe with the sinus pressure she has some intermittent lightheadedness sensation, described as only present with quick position changes such as standing up quickly or moving her head quickly.  Denies room spinning sensation.  States lightheadedness last for very brief few seconds and then completely resolves.  Denies any dizziness or lightheadedness sensation currently.  No syncope or near syncope.  Denies other aggravating or alleviating factors.  Reports otherwise feels well.  Denies chest pain, shortness of breath, abdominal pain, weakness, unilateral weakness, extremity pain, extremity swelling or rash. Denies recent sickness. Denies recent antibiotic use.   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 Asthma    Bronchitis induced  . H/O seasonal allergies   . Sjogren's disease (HCC)     There are no active problems to display for this patient.   Past  Surgical History:  Procedure Laterality Date  . NO PAST SURGERIES       No current facility-administered medications for this encounter.   Current Outpatient Medications:  .  aspirin 81 MG chewable tablet, Chew by mouth daily., Disp: , Rfl:  .  albuterol (PROVENTIL HFA;VENTOLIN HFA) 108 (90 Base) MCG/ACT inhaler, Inhale 1-2 puffs into the lungs every 6 (six) hours as needed for wheezing or shortness of breath., Disp: 1 Inhaler, Rfl: 0 .  amoxicillin-clavulanate (AUGMENTIN) 875-125 MG tablet, Take 1 tablet by mouth every 12 (twelve) hours., Disp: 20 tablet, Rfl: 0  Allergies Biaxin [clarithromycin]; Codeine; Doxycycline; Levaquin [levofloxacin]; and Sulfa antibiotics  Family History  Problem Relation Age of Onset  . Heart failure Father   . Hypertension Father     Social History Social History   Tobacco Use  . Smoking status: Current Every Day Smoker    Packs/day: 1.00    Types: Cigarettes  . Smokeless tobacco: Never Used  Substance Use Topics  . Alcohol use: No  . Drug use: No    Review of Systems Constitutional: No fever/chills ENT: No sore throat. As above.  Cardiovascular: Denies chest pain. Respiratory: Denies shortness of breath. Gastrointestinal: No abdominal pain.  Musculoskeletal: Negative for back pain. Skin: Negative for rash. Neurological: Negative for headaches, focal weakness or numbness.   ____________________________________________   PHYSICAL EXAM:  VITAL SIGNS: ED Triage Vitals  Enc Vitals Group     BP 07/18/17 1304 (!) 148/93     Pulse Rate 07/18/17 1304 76     Resp -- 18     Temp 07/18/17  1304 99 F (37.2 C)     Temp Source 07/18/17 1304 Oral     SpO2 07/18/17 1304 99 %     Weight 07/18/17 1305 232 lb (105.2 kg)     Height 07/18/17 1305 5' 7.5" (1.715 m)     Head Circumference --      Peak Flow --      Pain Score --      Pain Loc --      Pain Edu? --      Excl. in GC? --     Constitutional: Alert and oriented. Well appearing  and in no acute distress. Eyes: Conjunctivae are normal.  Head: Atraumatic.Mild tenderness to palpation bilateral frontal and and mild to moderate maxillary sinuses. No swelling. No erythema.   Ears: no erythema, normal TMs bilaterally.   Nose: nasal congestion with bilateral nasal turbinate erythema and edema.   Mouth/Throat: Mucous membranes are moist.  Oropharynx non-erythematous. No tonsillar swelling or exudate.  Neck: No stridor.  No cervical spine tenderness to palpation. Hematological/Lymphatic/Immunilogical: No cervical lymphadenopathy. Cardiovascular: Normal rate, regular rhythm. Grossly normal heart sounds.  Good peripheral circulation. Respiratory: Normal respiratory effort.  No retractions.No wheezes, rales or rhonchi. Good air movement.  Musculoskeletal: Changes positions quickly in room with steady gait. Neurologic:  Normal speech and language. No gross focal neurologic deficits are appreciated. No gait instability. Skin:  Skin is warm, dry and intact. No rash noted. Psychiatric: Mood and affect are normal. Speech and behavior are normal.  ___________________________________________   LABS (all labs ordered are listed, but only abnormal results are displayed)  Labs Reviewed - No data to display   PROCEDURES Procedures   INITIAL IMPRESSION / ASSESSMENT AND PLAN / ED COURSE  Pertinent labs & imaging results that were available during my care of the patient were reviewed by me and considered in my medical decision making (see chart for details).  Well-appearing patient.  No acute distress.  Suspect recent viral illness versus seasonal allergies, secondary maxillary sinusitis.  Encourage daily Claritin use, fluids, slow position changes, close monitoring of symptoms, Nettie pot use and supportive care.  Will treat with oral Augmentin.Discussed indication, risks and benefits of medications with patient.  Discussed follow up with Primary care physician this week. Discussed  follow up and return parameters including no resolution or any worsening concerns. Patient verbalized understanding and agreed to plan.   ____________________________________________   FINAL CLINICAL IMPRESSION(S) / ED DIAGNOSES  Final diagnoses:  Acute maxillary sinusitis, recurrence not specified     ED Discharge Orders        Ordered    amoxicillin-clavulanate (AUGMENTIN) 875-125 MG tablet  Every 12 hours     07/18/17 1331       Note: This dictation was prepared with Dragon dictation along with smaller phrase technology. Any transcriptional errors that result from this process are unintentional.         Renford DillsMiller, Nolene Rocks, NP 07/18/17 1342

## 2017-07-18 NOTE — Discharge Instructions (Addendum)
Take medication as prescribed. Rest. Drink plenty of fluids.  ° °Follow up with your primary care physician this week as needed. Return to Urgent care for new or worsening concerns.  ° °

## 2017-07-18 NOTE — ED Triage Notes (Signed)
Pt reports sinus congestion and "head fullness" worsening over last 2 weeks associated with dizziness

## 2017-07-21 ENCOUNTER — Other Ambulatory Visit: Payer: Self-pay

## 2017-07-21 ENCOUNTER — Emergency Department: Payer: Medicare HMO

## 2017-07-21 ENCOUNTER — Emergency Department
Admission: EM | Admit: 2017-07-21 | Discharge: 2017-07-21 | Disposition: A | Discharge status: Home / Self Care | Payer: Medicare HMO | Attending: Emergency Medicine | Admitting: Emergency Medicine

## 2017-07-21 ENCOUNTER — Encounter: Payer: Self-pay | Admitting: Emergency Medicine

## 2017-07-21 DIAGNOSIS — R1011 Right upper quadrant pain: Secondary | ICD-10-CM | POA: Diagnosis not present

## 2017-07-21 DIAGNOSIS — J45909 Unspecified asthma, uncomplicated: Secondary | ICD-10-CM | POA: Diagnosis not present

## 2017-07-21 DIAGNOSIS — R0781 Pleurodynia: Secondary | ICD-10-CM | POA: Diagnosis not present

## 2017-07-21 DIAGNOSIS — R109 Unspecified abdominal pain: Secondary | ICD-10-CM

## 2017-07-21 DIAGNOSIS — F17210 Nicotine dependence, cigarettes, uncomplicated: Secondary | ICD-10-CM | POA: Insufficient documentation

## 2017-07-21 DIAGNOSIS — R1010 Upper abdominal pain, unspecified: Secondary | ICD-10-CM

## 2017-07-21 LAB — COMPREHENSIVE METABOLIC PANEL
ALK PHOS: 70 U/L (ref 38–126)
ALT: 16 U/L (ref 0–44)
AST: 22 U/L (ref 15–41)
Albumin: 4 g/dL (ref 3.5–5.0)
Anion gap: 8 (ref 5–15)
BUN: 13 mg/dL (ref 6–20)
CALCIUM: 9 mg/dL (ref 8.9–10.3)
CO2: 26 mmol/L (ref 22–32)
Chloride: 106 mmol/L (ref 98–111)
Creatinine, Ser: 0.75 mg/dL (ref 0.44–1.00)
GFR calc non Af Amer: >60 mL/min (ref >60)
Glucose, Bld: 98 mg/dL (ref 70–99)
Potassium: 3.6 mmol/L (ref 3.5–5.1)
SODIUM: 140 mmol/L (ref 135–145)
TOTAL PROTEIN: 8 g/dL (ref 6.5–8.1)
Total Bilirubin: 0.5 mg/dL (ref 0.3–1.2)

## 2017-07-21 LAB — CBC
HCT: 36.9 % (ref 35.0–47.0)
HEMOGLOBIN: 13.1 g/dL (ref 12.0–16.0)
MCH: 30.9 pg (ref 26.0–34.0)
MCHC: 35.4 g/dL (ref 32.0–36.0)
MCV: 87.4 fL (ref 80.0–100.0)
Platelets: 352 10*3/uL (ref 150–440)
RBC: 4.23 MIL/uL (ref 3.80–5.20)
RDW: 14.1 % (ref 11.5–14.5)
WBC: 7.8 10*3/uL (ref 3.6–11.0)

## 2017-07-21 LAB — URINALYSIS, COMPLETE (UACMP) WITH MICROSCOPIC
Bacteria, UA: NONE SEEN
Bilirubin Urine: NEGATIVE
GLUCOSE, UA: NEGATIVE mg/dL
HGB URINE DIPSTICK: NEGATIVE
KETONES UR: NEGATIVE mg/dL
NITRITE: NEGATIVE
PROTEIN: NEGATIVE mg/dL
Specific Gravity, Urine: 1.008 (ref 1.005–1.030)
pH: 6 (ref 5.0–8.0)

## 2017-07-21 LAB — LIPASE, BLOOD: Lipase: 36 U/L (ref 11–51)

## 2017-07-21 LAB — TROPONIN I: Troponin I: <0.03 ng/mL (ref <0.03)

## 2017-07-21 MED ORDER — ACETAMINOPHEN 500 MG PO TABS
ORAL_TABLET | ORAL | Status: AC
  Filled 2017-07-21: qty 2

## 2017-07-21 MED ORDER — KETOROLAC TROMETHAMINE 60 MG/2ML IM SOLN
60.0000 mg | Freq: Once | INTRAMUSCULAR | Status: AC
  Administered 2017-07-21: 60 mg via INTRAMUSCULAR
  Filled 2017-07-21: qty 2

## 2017-07-21 MED ORDER — MORPHINE SULFATE (PF) 4 MG/ML IV SOLN
4.0000 mg | Freq: Once | INTRAVENOUS | Status: DC
  Filled 2017-07-21: qty 1

## 2017-07-21 MED ORDER — ONDANSETRON HCL 4 MG/2ML IJ SOLN
4.0000 mg | Freq: Once | INTRAMUSCULAR | Status: DC
  Filled 2017-07-21: qty 2

## 2017-07-21 MED ORDER — ACETAMINOPHEN 500 MG PO TABS
1000.0000 mg | ORAL_TABLET | Freq: Once | ORAL | Status: AC
  Administered 2017-07-21: 1000 mg via ORAL

## 2017-07-21 NOTE — ED Notes (Signed)
Pt not found in room. MD printed discharge papers but patient left prior to receiving.  No belongings left in room

## 2017-07-21 NOTE — ED Notes (Signed)
Patient transported to CT 

## 2017-07-21 NOTE — ED Notes (Signed)
Family at bedside. 

## 2017-07-21 NOTE — ED Triage Notes (Addendum)
Patient with complaint of intermittent right upper abdominal pain and nausea that started this morning. Patient reports that it has become worse throughout the day.

## 2017-07-21 NOTE — ED Provider Notes (Signed)
Patient CT abdomen shows no stones there are multiple small lymph nodes present but these were present previously.  The urine shows some red cells but no white blood cells and again no kidney stones present.  Patient is EKG was reviewed was normal sinus rhythm rate of 78 normal axis essentially no acute changes her lab work is also unrevealing she also had a normal right upper quadrant ultrasound and chest x-ray.  At the present time patient's pain is better there is only minimal tenderness no right upper quadrant to palpation.  The pain is made worse with movement or bumps in the road but again its better now.  After reviewing her lab work and talking to her not sure what is going on she is not having any  urinary frequency urgency or dysuria.  He is not short of breath she is not running fever she has no CVA tenderness or pain of the spine.  I will let her go have her come back if she is worse or not any better in a few days and follow-up with her doctor.   Arnaldo NatalMalinda, Paul F, MD 07/21/17 [phone]

## 2017-07-21 NOTE — Discharge Instructions (Addendum)
Please return for worse pain fever vomiting or if you feel sicker.  Please follow-up with your regular doctor in about a week to check and make sure the small amount of red blood cells are out of your urine.  Please use Tylenol or Motrin for the pain.  If you use Motrin try 3 of the over-the-counter pills 3 times a day with food for only 2 days.  If the pain continues after that please return or see your doctor.  Do not take Motrin for longer than that for this problem.

## 2017-07-21 NOTE — ED Provider Notes (Addendum)
[facility] Emergency Department Provider Note   ____________________________________________   First MD Initiated Contact with Patient 07/21/17 [phone]     (approximate)  I have reviewed the triage vital signs and the nursing notes.   HISTORY  Chief Complaint Abdominal Pain    HPI Karen Reed is a 57 y.o. female who comes into the hospital today with some right upper quadrant abdominal pain.  The patient states that she is had some stabbing in her ribs that started this morning but is gotten worse over the day.  The patient took some Tylenol but it did not help.  She is never had this pain before.  She has been nauseous with no vomiting.  The patient denies any fever.  She rates her pain a 10 out of 10 in intensity currently.  She denies any chest pain, shortness of breath.  The patient has been taking an antibiotic for sinus infection and did have a loose stool today.  The patient is concerned and wants to know why she is having all this pain so she is here today for evaluation of her symptoms.   Past Medical History:  Diagnosis Date  . Asthma    Bronchitis induced  . H/O seasonal allergies   . Sjogren's disease (HCC)     There are no active problems to display for this patient.   Past Surgical History:  Procedure Laterality Date  . NO PAST SURGERIES      Prior to Admission medications   Medication Sig Start Date End Date Taking? Authorizing Provider  albuterol (PROVENTIL HFA;VENTOLIN HFA) 108 (90 Base) MCG/ACT inhaler Inhale 1-2 puffs into the lungs every 6 (six) hours as needed for wheezing or shortness of breath. 03/22/17   Tommie Samsook, Jayce G, DO  amoxicillin-clavulanate (AUGMENTIN) 875-125 MG tablet Take 1 tablet by mouth every 12 (twelve) hours. 07/18/17   Renford DillsMiller, Lindsey, NP  aspirin 81 MG chewable tablet Chew by mouth daily.    [provider]    Allergies Biaxin [clarithromycin]; Codeine; Doxycycline; Levaquin [levofloxacin]; and Sulfa  antibiotics  Family History  Problem Relation Age of Onset  . Heart failure Father   . Hypertension Father     Social History Social History   Tobacco Use  . Smoking status: Current Every Day Smoker    Packs/day: 1.00    Types: Cigarettes  . Smokeless tobacco: Never Used  Substance Use Topics  . Alcohol use: No  . Drug use: No    Review of Systems  Constitutional: No fever/chills Eyes: No visual changes. ENT: No sore throat. Cardiovascular: Denies chest pain. Respiratory: Denies shortness of breath. Gastrointestinal:  abdominal pain, nausea, no vomiting.  No diarrhea.  No constipation. Genitourinary: Negative for dysuria. Musculoskeletal: Negative for back pain. Skin: Negative for rash. Neurological: Negative for headaches, focal weakness or numbness.   ____________________________________________   PHYSICAL EXAM:  VITAL SIGNS: ED Triage Vitals  Enc Vitals Group     BP 07/21/17 0234 (!) 147/74     Pulse Rate 07/21/17 0234 81     Resp 07/21/17 0234 18     Temp 07/21/17 0234 98.3 F (36.8 C)     Temp Source 07/21/17 0234 Oral     SpO2 07/21/17 0234 100 %     Weight 07/21/17 0235 232 lb (105.2 kg)     Height 07/21/17 0235 5' 7.5" (1.715 m)     Head Circumference --      Peak Flow --  Pain Score 07/21/17 0235 10     Pain Loc --      Pain Edu? --      Excl. in GC? --     Constitutional: Alert and oriented. Well appearing and in moderate distress. Eyes: Conjunctivae are normal. PERRL. EOMI. Head: Atraumatic. Nose: No congestion/rhinnorhea. Mouth/Throat: Mucous membranes are moist.  Oropharynx non-erythematous. Cardiovascular: Normal rate, regular rhythm. Grossly normal heart sounds.  Good peripheral circulation. Respiratory: Normal respiratory effort.  No retractions. Lungs CTAB. Gastrointestinal: Soft with some right upper quadrant tenderness to palpation. No distention.  Positive bowel sounds Musculoskeletal: No lower extremity tenderness nor edema.    Neurologic:  Normal speech and language.  Skin:  Skin is warm, dry and intact.  Psychiatric: Mood and affect are normal.   ____________________________________________   LABS (all labs ordered are listed, but only abnormal results are displayed)  Labs Reviewed  URINALYSIS, COMPLETE (UACMP) WITH MICROSCOPIC - Abnormal; Notable for the following components:      Result Value   Color, Urine STRAW (*)    APPearance CLEAR (*)    Leukocytes, UA MODERATE (*)    All other components within normal limits  LIPASE, BLOOD  COMPREHENSIVE METABOLIC PANEL  CBC  TROPONIN I  POC URINE PREG, ED   ____________________________________________  EKG  ED ECG REPORT I, Rebecka Apley, the attending physician, personally viewed and interpreted this ECG.   Date: 07/21/2017  EKG Time: 0238  Rate: 78  Rhythm: normal sinus rhythm  Axis: normal  Intervals:none  ST&T Change: none  ____________________________________________  RADIOLOGY  ED MD interpretation: Ultrasound abdomen right upper quadrant: Normal right upper quadrant ultrasound Chest x-ray: No acute findings   Official radiology report(s): Dg Chest 2 View  Result Date: 07/21/2017 CLINICAL DATA:  Right lower quadrant abdominal pain. EXAM: CHEST - 2 VIEW COMPARISON:  Chest radiograph 01/27/2017 FINDINGS: The cardiomediastinal contours are normal. The lungs are clear. Pulmonary vasculature is normal. No consolidation, pleural effusion, or pneumothorax. No acute osseous abnormalities are seen. Degenerative change in the spine. IMPRESSION: No acute findings. Electronically Signed   By: Rubye Oaks M.D.   On: 07/21/2017 06:01   US Abdomen Limited Ruq  Result Date: 07/21/2017 CLINICAL DATA:  Initial evaluation for acute abdominal pain for 1 day. EXAM: ULTRASOUND ABDOMEN LIMITED RIGHT UPPER QUADRANT COMPARISON:  None. FINDINGS: Gallbladder: No gallstones or wall thickening visualized. No sonographic Murphy sign noted by sonographer.  Common bile duct: Diameter: 3 mm Liver: No focal lesion identified. Within normal limits in parenchymal echogenicity. Portal vein is patent on color Doppler imaging with normal direction of blood flow towards the liver. IMPRESSION: Normal right upper quadrant ultrasound. Electronically Signed   By: Rise Mu M.D.   On: 07/21/2017 05:01    ____________________________________________   PROCEDURES  Procedure(s) performed: None  Procedures  Critical Care performed: No  ____________________________________________   INITIAL IMPRESSION / ASSESSMENT AND PLAN / ED COURSE  As part of my medical decision making, I reviewed the following data within the electronic MEDICAL RECORD NUMBER Notes from prior ED visits and [place] Controlled Substance Database   This is a 57 year old female who comes into the hospital today with some right upper quadrant abdominal pain.  My differential diagnosis includes pancreatitis, cholecystitis, cholelithiasis, choledocholithiasis.  The patient did have some blood work drawn to include a CBC, CMP, lipase and a troponin.  The patient's blood work is unremarkable.  I did order some morphine for the patient but she states that she does not  like how it makes her feel and she does not want narcotics right now.  I will send the patient for an ultrasound to look at her gallbladder and she will be reassessed.  The patient's ultrasound and x-ray are unremarkable.  I did give her some Toradol and she states that she feels some mild improvement but she still has pain.  I did encourage the patient to give [place] a urine sample and I will send her for a renal stone CT scan looking for possible other causes of her pain.  The patient's care will be signed out to Dr. Darnelle Catalan who will follow up the results of the CT scan and urinalysis and reassess and disposition the patient.      ____________________________________________   FINAL CLINICAL IMPRESSION(S) / ED  DIAGNOSES  Final diagnoses:  Abdominal pain  Flank pain     ED Discharge Orders    None       Note:  This document was prepared using Dragon voice recognition software and may include unintentional dictation errors.    Rebecka Apley, MD 07/21/17 0458    Rebecka Apley, MD 07/21/17 [phone]

## 2017-07-21 NOTE — ED Notes (Signed)
Patient transported to Ultrasound 

## 2017-07-21 NOTE — ED Notes (Signed)
Patient states that she does not want Morphine or Zofran at this time. Dr. Zenda AlpersWebster notified.

## 2017-07-24 ENCOUNTER — Ambulatory Visit
Admission: RE | Admit: 2017-07-24 | Discharge: 2017-07-24 | Disposition: A | Discharge status: Home / Self Care | Payer: Medicare HMO | Source: Ambulatory Visit | Attending: Internal Medicine | Admitting: Internal Medicine

## 2017-07-24 ENCOUNTER — Encounter: Payer: Self-pay | Admitting: Radiology

## 2017-07-24 DIAGNOSIS — R609 Edema, unspecified: Secondary | ICD-10-CM | POA: Diagnosis present

## 2017-07-24 DIAGNOSIS — R590 Localized enlarged lymph nodes: Secondary | ICD-10-CM | POA: Diagnosis not present

## 2017-07-24 DIAGNOSIS — M3501 Sicca syndrome with keratoconjunctivitis: Secondary | ICD-10-CM | POA: Insufficient documentation

## 2017-07-24 MED ORDER — IOPAMIDOL (ISOVUE-300) INJECTION 61%
50.0000 mL | Freq: Once | INTRAVENOUS | Status: AC | PRN
  Administered 2017-07-24: 75 mL via INTRAVENOUS

## 2017-08-20 DIAGNOSIS — R9389 Abnormal findings on diagnostic imaging of other specified body structures: Secondary | ICD-10-CM | POA: Insufficient documentation

## 2017-08-26 ENCOUNTER — Other Ambulatory Visit: Payer: Self-pay | Admitting: Internal Medicine

## 2017-12-27 ENCOUNTER — Encounter: Payer: Self-pay | Admitting: Emergency Medicine

## 2017-12-27 ENCOUNTER — Emergency Department: Payer: Medicare HMO

## 2017-12-27 ENCOUNTER — Emergency Department
Admission: EM | Admit: 2017-12-27 | Discharge: 2017-12-27 | Disposition: A | Discharge status: Home / Self Care | Payer: Medicare HMO | Attending: Emergency Medicine | Admitting: Emergency Medicine

## 2017-12-27 ENCOUNTER — Other Ambulatory Visit: Payer: Self-pay

## 2017-12-27 DIAGNOSIS — Z7982 Long term (current) use of aspirin: Secondary | ICD-10-CM | POA: Insufficient documentation

## 2017-12-27 DIAGNOSIS — M25561 Pain in right knee: Secondary | ICD-10-CM | POA: Diagnosis present

## 2017-12-27 DIAGNOSIS — Z79899 Other long term (current) drug therapy: Secondary | ICD-10-CM | POA: Diagnosis not present

## 2017-12-27 DIAGNOSIS — J45909 Unspecified asthma, uncomplicated: Secondary | ICD-10-CM | POA: Insufficient documentation

## 2017-12-27 DIAGNOSIS — F17210 Nicotine dependence, cigarettes, uncomplicated: Secondary | ICD-10-CM | POA: Insufficient documentation

## 2017-12-27 MED ORDER — OXYCODONE-ACETAMINOPHEN 5-325 MG PO TABS
1.0000 | ORAL_TABLET | Freq: Once | ORAL | Status: DC
  Filled 2017-12-27: qty 1

## 2017-12-27 MED ORDER — TRAMADOL HCL 50 MG PO TABS: ORAL_TABLET | ORAL | 0 refills | Status: DC

## 2017-12-27 MED ORDER — ACETAMINOPHEN 325 MG PO TABS
650.0000 mg | ORAL_TABLET | Freq: Once | ORAL | Status: AC
  Administered 2017-12-27: 650 mg via ORAL
  Filled 2017-12-27: qty 2

## 2017-12-27 NOTE — ED Provider Notes (Signed)
[facility] Emergency Department Provider Note  ____________________________________________   First MD Initiated Contact with Patient 12/27/17 0408     (approximate)  I have reviewed the triage vital signs and the nursing notes.   HISTORY  Chief Complaint Knee Pain    HPI Karen Reed is a 57 y.o. female with arthritis but no other contributory past medical history who presents for evaluation of relatively acute onset right knee pain.  This started this evening and has gradually gotten worse until she felt that she needed to come to the emergency department in the middle of the night for evaluation.  She had no known trauma of which she is aware.  She cares for her children during the day and does not think that she sustained any specific injury.  She has no swelling and no skin changes.  She reports that she has gotten a joint injection in that knee in the past but it was years ago.  Weightbearing and flexing the knee makes it worse and nothing in particular makes it better.  She has no history of blood clots in the legs nor the lungs and she is an active person and frequently on her feet with ambulation.  Past Medical History:  Diagnosis Date  . Asthma    Bronchitis induced  . H/O seasonal allergies   . Sjogren's disease (HCC)     There are no active problems to display for this patient.   Past Surgical History:  Procedure Laterality Date  . NO PAST SURGERIES      Prior to Admission medications   Medication Sig Start Date End Date Taking? Authorizing Provider  albuterol (PROVENTIL HFA;VENTOLIN HFA) 108 (90 Base) MCG/ACT inhaler Inhale 1-2 puffs into the lungs every 6 (six) hours as needed for wheezing or shortness of breath. 03/22/17   Tommie Samsook, Jayce G, DO  amoxicillin-clavulanate (AUGMENTIN) 875-125 MG tablet Take 1 tablet by mouth every 12 (twelve) hours. 07/18/17   Renford DillsMiller, Lindsey, NP  aspirin 81 MG chewable tablet Chew by mouth daily.    [provider]  atorvastatin (LIPITOR) 40 MG tablet Take 40 mg by mouth daily. 06/12/17   [provider]  chlorthalidone (HYGROTON) 25 MG tablet Take 25 mg by mouth every morning. 06/11/17   [provider]  citalopram (CELEXA) 20 MG tablet Take 20 mg by mouth daily. 06/11/17   [provider]  NICOTINE STEP 1 21 MG/24HR patch Place 1 patch onto the skin daily. 07/15/17   [provider]  traMADol Janean Sark(ULTRAM) 50 MG tablet Take 1-2 tablets by mouth every 6 hours as needed for moderate to severe pain 12/27/17   Loleta RoseForbach, Tsuneo Faison, MD    Allergies Biaxin [clarithromycin]; Codeine; Doxycycline; Levaquin [levofloxacin]; and Sulfa antibiotics  Family History  Problem Relation Age of Onset  . Heart failure Father   . Hypertension Father     Social History Social History   Tobacco Use  . Smoking status: Current Every Day Smoker    Packs/day: 1.00    Types: Cigarettes  . Smokeless tobacco: Never Used  Substance Use Topics  . Alcohol use: No  . Drug use: No    Review of Systems Constitutional: No fever/chills Cardiovascular: Denies chest pain. Respiratory: Denies shortness of breath. Gastrointestinal: No abdominal pain.  No nausea, no vomiting.   Musculoskeletal: Acute pain in the right knee as described above.  No swelling and no discoloration. Neurological: Negative for headaches, focal weakness or numbness.   ____________________________________________  PHYSICAL EXAM:  VITAL SIGNS: ED Triage Vitals  Enc Vitals Group     BP 12/27/17 0339 (!) 142/81     Pulse Rate 12/27/17 0339 90     Resp 12/27/17 0339 (!) 22     Temp 12/27/17 0339 98.1 F (36.7 C)     Temp Source 12/27/17 0339 Oral     SpO2 12/27/17 0339 98 %     Weight 12/27/17 0331 99.8 kg (220 lb)     Height 12/27/17 0331 1.702 m (5\' 7" )     Head Circumference --      Peak Flow --      Pain Score 12/27/17 0331 10     Pain Loc --      Pain Edu? --      Excl. in GC? --      Constitutional: Alert and oriented.  No acute distress but does appear uncomfortable and is holding her right knee. Eyes: Conjunctivae are normal.  Head: Atraumatic. Nose: No congestion/rhinnorhea. Cardiovascular: Normal rate, regular rhythm. Good peripheral circulation. Respiratory: Normal respiratory effort.  No retractions.  Musculoskeletal: No obvious swelling of the right knee or leg compared to the left.  She has no knee effusion, no erythema, no abnormal warmth of the knee.  She has no joint laxity but reports pain when I attempt to manipulate the knee, either with flexion and extension or with manipulation from side to side. Neurologic:  Normal speech and language. No gross focal neurologic deficits are appreciated.  Skin:  Skin is warm, dry and intact. No rash noted. Psychiatric: Mood and affect are normal. Speech and behavior are normal.  ____________________________________________   LABS (all labs ordered are listed, but only abnormal results are displayed)  Labs Reviewed - No data to display ____________________________________________  EKG  None - EKG not ordered by ED physician ____________________________________________  RADIOLOGY   ED MD interpretation: Arthritis but without any acute abnormality  Official radiology report(s): Dg Knee Complete 4 Views Right  Result Date: 12/27/2017 CLINICAL DATA:  Right knee pain. No known injury. EXAM: RIGHT KNEE - COMPLETE 4+ VIEW COMPARISON:  None. FINDINGS: No evidence of fracture, dislocation, or joint effusion. Tricompartmental peripheral spurring and spurring of the tibial spines. No bony destructive change or focal bone abnormality. Soft tissues are unremarkable. 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 Electronically Signed   By: Narda Rutherford M.D.   On: 12/27/2017 04:06    ____________________________________________   PROCEDURES  Critical Care performed:  No   Procedure(s) performed:   Procedures   ____________________________________________   INITIAL IMPRESSION / ASSESSMENT AND PLAN / ED COURSE  As part of my medical decision making, I reviewed the following data within the electronic MEDICAL RECORD NUMBER Nursing notes reviewed and incorporated, Old chart reviewed, Notes from prior ED visits and [place] Controlled Substance Database    Differential diagnosis includes, but is not limited to, ligamentous injury, inflammatory effusion, arthritis, septic joint, gout.  The patient has no external physical signs of any abnormality other than the tenderness associated with manipulation of the joint.  There is no effusion and I think that there is very limited benefit to performing an arthrocentesis given the low probability that she has a spontaneous septic joint.  She indicates a very specific infrapatellar point on the medial side that I think likely represents either inflammation or strain/sprain of 1 of her ligaments, possibly the MCL.  There is no indication for an emergent MRI tonight.  I did bring up the possibility  of a DVT but I think it is very unlikely based on the history and the location of the pain and she agrees that she does not feel she needs an ultrasound at this time.  Given the amount of pain she is reporting I will put her in a knee immobilizer for presumed internal derangement of her knee.  I will give crutches for additional stability.  I try to give her a Percocet but she is concerned based on prior hives due to codeine so she declined.  She will use over-the-counter pain medication, elevation, and ice.  She will follow-up with orthopedics on Monday.  I gave my usual and customary return precautions.     ____________________________________________  FINAL CLINICAL IMPRESSION(S) / ED DIAGNOSES  Final diagnoses:  Acute pain of right knee     MEDICATIONS GIVEN DURING THIS VISIT:  Medications  oxyCODONE-acetaminophen  (PERCOCET/ROXICET) 5-325 MG per tablet 1 tablet (1 tablet Oral Refused 12/27/17 0510)  acetaminophen (TYLENOL) tablet 650 mg (650 mg Oral Given 12/27/17 0509)     ED Discharge Orders         Ordered    traMADol (ULTRAM) 50 MG tablet     12/27/17 2952           Note:  This document was prepared using Dragon voice recognition software and may include unintentional dictation errors.    Loleta Rose, MD 12/27/17 [phone]

## 2017-12-27 NOTE — ED Triage Notes (Signed)
Pt arrives POV to triage with c/o right knee pain. Pt denies injury or trauma at this time but states that she cannot bear wait on the same leg. Pt is in NAD.

## 2017-12-27 NOTE — Discharge Instructions (Signed)
As we discussed, we did not identify a specific cause of your knee pain at this time, but your x-rays were reassuring and there is no physical abnormality that we can identify.  It is possible that you caused some internal injury to the knee somehow and have ligamentous or tendon damage.  We placed you in a knee immobilizer given your inability to bear weight or flex the knee appropriately.  Please keep the immobilizer in place particularly when you are walking around.  Use the crutches as needed.  Use over-the-counter pain medicine as needed including ibuprofen and Tylenol.  We understand that you had a reaction to codeine in the past and typically people are able to tolerate tramadol, for which I provided a prescription, but I understand if you do not want to take the chance.  Try keeping your leg elevated when possible and using cold therapy.  Follow-up with orthopedics on Monday.  Return to the emergency department if you develop new or worsening symptoms that concern you.

## 2018-01-29 ENCOUNTER — Encounter: Payer: Self-pay | Admitting: Emergency Medicine

## 2018-01-29 ENCOUNTER — Other Ambulatory Visit: Payer: Self-pay

## 2018-01-29 ENCOUNTER — Ambulatory Visit
Admission: EM | Admit: 2018-01-29 | Discharge: 2018-01-29 | Disposition: A | Discharge status: Home / Self Care | Payer: 59 | Attending: Family Medicine | Admitting: Family Medicine

## 2018-01-29 DIAGNOSIS — Z720 Tobacco use: Secondary | ICD-10-CM | POA: Diagnosis not present

## 2018-01-29 DIAGNOSIS — R030 Elevated blood-pressure reading, without diagnosis of hypertension: Secondary | ICD-10-CM | POA: Diagnosis not present

## 2018-01-29 DIAGNOSIS — J988 Other specified respiratory disorders: Secondary | ICD-10-CM | POA: Insufficient documentation

## 2018-01-29 LAB — CBC WITH DIFFERENTIAL/PLATELET
Abs Immature Granulocytes: 0.02 10*3/uL (ref 0.00–0.07)
Basophils Absolute: 0 10*3/uL (ref 0.0–0.1)
Basophils Relative: 0 %
EOS PCT: 2 %
Eosinophils Absolute: 0.1 10*3/uL (ref 0.0–0.5)
HCT: 35.5 % — ABNORMAL LOW (ref 36.0–46.0)
Hemoglobin: 11.7 g/dL — ABNORMAL LOW (ref 12.0–15.0)
Immature Granulocytes: 0 %
Lymphocytes Relative: 39 %
Lymphs Abs: 2.8 10*3/uL (ref 0.7–4.0)
MCH: 29.3 pg (ref 26.0–34.0)
MCHC: 33 g/dL (ref 30.0–36.0)
MCV: 89 fL (ref 80.0–100.0)
MONO ABS: 0.5 10*3/uL (ref 0.1–1.0)
Monocytes Relative: 8 %
Neutro Abs: 3.6 10*3/uL (ref 1.7–7.7)
Neutrophils Relative %: 51 %
Platelets: 314 10*3/uL (ref 150–400)
RBC: 3.99 MIL/uL (ref 3.87–5.11)
RDW: 15.1 % (ref 11.5–15.5)
WBC: 7.1 10*3/uL (ref 4.0–10.5)
nRBC: 0 % (ref 0.0–0.2)

## 2018-01-29 LAB — COMPREHENSIVE METABOLIC PANEL
ALT: 21 U/L (ref 0–44)
AST: 21 U/L (ref 15–41)
Albumin: 3.7 g/dL (ref 3.5–5.0)
Alkaline Phosphatase: 72 U/L (ref 38–126)
Anion gap: 8 (ref 5–15)
BUN: 12 mg/dL (ref 6–20)
CALCIUM: 8.8 mg/dL — AB (ref 8.9–10.3)
CO2: 25 mmol/L (ref 22–32)
Chloride: 107 mmol/L (ref 98–111)
Creatinine, Ser: 0.59 mg/dL (ref 0.44–1.00)
GFR calc Af Amer: >60 mL/min (ref >60)
GFR calc non Af Amer: >60 mL/min (ref >60)
Glucose, Bld: 92 mg/dL (ref 70–99)
Potassium: 3.9 mmol/L (ref 3.5–5.1)
Sodium: 140 mmol/L (ref 135–145)
TOTAL PROTEIN: 7.4 g/dL (ref 6.5–8.1)
Total Bilirubin: 0.6 mg/dL (ref 0.3–1.2)

## 2018-01-29 LAB — TROPONIN I: Troponin I: <0.03 ng/mL (ref <0.03)

## 2018-01-29 MED ORDER — KETOROLAC TROMETHAMINE 60 MG/2ML IM SOLN: 30.0000 mg | Freq: Once | INTRAMUSCULAR | Status: DC

## 2018-01-29 MED ORDER — ACETAMINOPHEN 500 MG PO TABS
1000.0000 mg | ORAL_TABLET | Freq: Once | ORAL | Status: AC
  Administered 2018-01-29: 1000 mg via ORAL

## 2018-01-29 MED ORDER — AMOXICILLIN-POT CLAVULANATE 875-125 MG PO TABS: 1.0000 | ORAL_TABLET | Freq: Two times a day (BID) | ORAL | 0 refills | Status: DC

## 2018-01-29 NOTE — ED Provider Notes (Signed)
[facility]    CSN: [id number] Arrival date & time: 01/29/18  1851  History   Chief Complaint Chief Complaint  Patient presents with  . Chest Pain  . Shortness of Breath   HPI  58 year old female presents with the above complaints.  Patient has not been well for the past 3 to 4 days.  She reports she has had ongoing cough and headahce. She has developed shortness of breath and chest pain as of yesterday.  Intermittent. Chest pain is located centrally.  Described as pressure.  Has had a productive cough.  Has had some facial swelling and pressure.  No documented fever.  No known exacerbating or relieving factors.  No other reported symptoms.  No other complaints/concerns at this time.  PMH, Surgical Hx, Family Hx, Social History reviewed and updated as below.  Past Medical History:  Diagnosis Date  . Asthma    Bronchitis induced  . H/O seasonal allergies   . Sjogren's disease [facility](HCC)    Past Surgical History:  Procedure Laterality Date  . NO PAST SURGERIES     OB History   No obstetric history on file.    Home Medications    Prior to Admission medications   Medication Sig Start Date End Date Taking? Authorizing Provider  aspirin 81 MG chewable tablet Chew by mouth daily.   Yes [provider]  amoxicillin-clavulanate (AUGMENTIN) 875-125 MG tablet Take 1 tablet by mouth every 12 (twelve) hours. 01/29/18   Tommie Samsook, Efrat Zuidema G, DO   Family History Family History  Problem Relation Age of Onset  . Heart failure Father   . Hypertension Father    Social History Social History   Tobacco Use  . Smoking status: Current Every Day Smoker    Packs/day: 1.00    Types: Cigarettes  . Smokeless tobacco: Never Used  Substance Use Topics  . Alcohol use: No  . Drug use: No   Allergies   Biaxin [clarithromycin]; Codeine; Doxycycline; Levaquin [levofloxacin]; and Sulfa antibiotics   Review of Systems Review of Systems  Constitutional: Negative for fever.    Respiratory: Positive for cough and shortness of breath.   Cardiovascular: Positive for chest pain.  Neurological: Positive for headaches.   Physical Exam Triage Vital Signs ED Triage Vitals  Enc Vitals Group     BP 01/29/18 1916 (!) 180/96     Pulse Rate 01/29/18 1916 83     Resp 01/29/18 1916 20     Temp 01/29/18 1916 98.9 F (37.2 C)     Temp Source 01/29/18 1916 Oral     SpO2 01/29/18 1916 99 %     Weight 01/29/18 1912 215 lb (97.5 kg)     Height 01/29/18 1912 5\' 7"  (1.702 m)     Head Circumference --      Peak Flow --      Pain Score 01/29/18 1912 5     Pain Loc --      Pain Edu? --      Excl. in GC? --    Updated Vital Signs BP (!) 180/96 (BP Location: Left Arm)   Pulse 83   Temp 98.9 F (37.2 C) (Oral)   Resp 20   Ht 5\' 7"  (1.702 m)   Wt 97.5 kg   SpO2 99%   BMI 33.67 kg/m   Visual Acuity Right Eye Distance:   Left Eye Distance:   Bilateral Distance:    Right Eye Near:   Left Eye Near:    Bilateral  Near:     Physical Exam Vitals signs and nursing note reviewed.  Constitutional:      General: She is not in acute distress. HENT:     Head: Normocephalic and atraumatic.     Right Ear: Tympanic membrane normal.     Left Ear: Tympanic membrane normal.     Nose: Nose normal.     Comments: Maxillary sinus tenderness to palpation.    Mouth/Throat:     Pharynx: Oropharynx is clear. No posterior oropharyngeal erythema.  Cardiovascular:     Rate and Rhythm: Normal rate and regular rhythm.  Pulmonary:     Effort: Pulmonary effort is normal.     Breath sounds: No wheezing, rhonchi or rales.  Chest:     Chest wall: Tenderness present.  Neurological:     Mental Status: She is alert.  Psychiatric:        Mood and Affect: Mood normal.        Behavior: Behavior normal.    UC Treatments / Results  Labs (all labs ordered are listed, but only abnormal results are displayed) Labs Reviewed  CBC WITH DIFFERENTIAL/PLATELET - Abnormal; Notable for the  following components:      Result Value   Hemoglobin 11.7 (*)    HCT 35.5 (*)    All other components within normal limits  COMPREHENSIVE METABOLIC PANEL - Abnormal; Notable for the following components:   Calcium 8.8 (*)    All other components within normal limits  TROPONIN I    EKG Interpretation: Normal sinus rhythm with a rate of 81.  Normal axis.  Normal intervals.  No ST or T wave changes.  Normal EKG.  Radiology No results found.  Procedures Procedures (including critical care time)  Medications Ordered in UC Medications  acetaminophen (TYLENOL) tablet 1,000 mg (1,000 mg Oral Given 01/29/18 2010)    Initial Impression / Assessment and Plan / UC Course  I have reviewed the triage vital signs and the nursing notes.  Pertinent labs & imaging results that were available during my care of the patient were reviewed by me and considered in my medical decision making (see chart for details).    58 year old female presents with chest pain, shortness of breath.  Laboratory studies essentially unremarkable.  Negative troponin.  EKG normal.  I suspect that the patient's symptoms are likely likely from underlying respiratory infection.  Given presentation and clinical exam I am placing her on Augmentin.  Supportive care.  Final Clinical Impressions(s) / UC Diagnoses   Final diagnoses:  Respiratory infection     Discharge Instructions     Rest. Fluids.  Medication as directed.  Labs were good.   Take care  Dr. Adriana Simas    ED Prescriptions    Medication Sig Dispense Auth. Provider   amoxicillin-clavulanate (AUGMENTIN) 875-125 MG tablet Take 1 tablet by mouth every 12 (twelve) hours. 14 tablet Tommie Sams, DO     Controlled Substance Prescriptions [place] Controlled Substance Registry consulted? Not Applicable   Tommie Sams, DO 01/29/18 2048

## 2018-01-29 NOTE — ED Triage Notes (Signed)
Pt c/o chest pressure between her breast when she walks that takes her breath away. Also c/o facial swelling. She also had sweats when she first noticed this. Started last night and has had it intermittently all day today.

## 2018-01-29 NOTE — Discharge Instructions (Signed)
Rest. Fluids.  Medication as directed.  Labs were good.   Take care  Dr. Adriana Simas

## 2018-02-11 ENCOUNTER — Observation Stay
Admission: EM | Admit: 2018-02-11 | Discharge: 2018-02-12 | Disposition: A | Discharge status: Home / Self Care | Payer: 59 | Attending: Internal Medicine | Admitting: Internal Medicine

## 2018-02-11 ENCOUNTER — Other Ambulatory Visit: Payer: Self-pay

## 2018-02-11 ENCOUNTER — Emergency Department: Payer: 59

## 2018-02-11 DIAGNOSIS — Z791 Long term (current) use of non-steroidal anti-inflammatories (NSAID): Secondary | ICD-10-CM | POA: Diagnosis not present

## 2018-02-11 DIAGNOSIS — R42 Dizziness and giddiness: Secondary | ICD-10-CM | POA: Diagnosis present

## 2018-02-11 DIAGNOSIS — Z79899 Other long term (current) drug therapy: Secondary | ICD-10-CM | POA: Insufficient documentation

## 2018-02-11 DIAGNOSIS — E039 Hypothyroidism, unspecified: Secondary | ICD-10-CM | POA: Diagnosis not present

## 2018-02-11 DIAGNOSIS — J45909 Unspecified asthma, uncomplicated: Secondary | ICD-10-CM | POA: Insufficient documentation

## 2018-02-11 DIAGNOSIS — Z7982 Long term (current) use of aspirin: Secondary | ICD-10-CM | POA: Insufficient documentation

## 2018-02-11 DIAGNOSIS — R002 Palpitations: Secondary | ICD-10-CM | POA: Diagnosis present

## 2018-02-11 DIAGNOSIS — F17210 Nicotine dependence, cigarettes, uncomplicated: Secondary | ICD-10-CM | POA: Diagnosis not present

## 2018-02-11 LAB — COMPREHENSIVE METABOLIC PANEL
ALT: 23 U/L (ref 0–44)
ANION GAP: 8 (ref 5–15)
AST: 20 U/L (ref 15–41)
Albumin: 4 g/dL (ref 3.5–5.0)
Alkaline Phosphatase: 81 U/L (ref 38–126)
BUN: 14 mg/dL (ref 6–20)
CHLORIDE: 102 mmol/L (ref 98–111)
CO2: 28 mmol/L (ref 22–32)
Calcium: 9.1 mg/dL (ref 8.9–10.3)
Creatinine, Ser: 0.96 mg/dL (ref 0.44–1.00)
GFR calc Af Amer: >60 mL/min (ref >60)
GFR calc non Af Amer: >60 mL/min (ref >60)
Glucose, Bld: 95 mg/dL (ref 70–99)
Potassium: 3.7 mmol/L (ref 3.5–5.1)
Sodium: 138 mmol/L (ref 135–145)
Total Bilirubin: 0.6 mg/dL (ref 0.3–1.2)
Total Protein: 8.4 g/dL — ABNORMAL HIGH (ref 6.5–8.1)

## 2018-02-11 LAB — CBC
HCT: 42.4 % (ref 36.0–46.0)
Hemoglobin: 13.7 g/dL (ref 12.0–15.0)
MCH: 28.8 pg (ref 26.0–34.0)
MCHC: 32.3 g/dL (ref 30.0–36.0)
MCV: 89.3 fL (ref 80.0–100.0)
PLATELETS: 378 10*3/uL (ref 150–400)
RBC: 4.75 MIL/uL (ref 3.87–5.11)
RDW: 14.5 % (ref 11.5–15.5)
WBC: 7 10*3/uL (ref 4.0–10.5)
nRBC: 0 % (ref 0.0–0.2)

## 2018-02-11 LAB — TROPONIN I: Troponin I: <0.03 ng/mL (ref <0.03)

## 2018-02-11 NOTE — ED Triage Notes (Signed)
Pt in with co heart racing for 2 days, hx of the same and was told she had a murmur.

## 2018-02-11 NOTE — ED Notes (Signed)
Dr Brown and Butch, RN at bedside at this time. 

## 2018-02-12 ENCOUNTER — Observation Stay
Admit: 2018-02-12 | Discharge: 2018-02-12 | Disposition: A | Discharge status: Home / Self Care | Payer: 59 | Attending: Internal Medicine | Admitting: Internal Medicine

## 2018-02-12 DIAGNOSIS — R002 Palpitations: Secondary | ICD-10-CM | POA: Diagnosis present

## 2018-02-12 LAB — ECHOCARDIOGRAM COMPLETE
Height: 67 in
Weight: 3590.4 oz

## 2018-02-12 LAB — TSH: TSH: 6.184 u[IU]/mL — ABNORMAL HIGH (ref 0.350–4.500)

## 2018-02-12 LAB — TROPONIN I: Troponin I: <0.03 ng/mL (ref <0.03)

## 2018-02-12 LAB — FIBRIN DERIVATIVES D-DIMER (ARMC ONLY): Fibrin derivatives D-dimer (ARMC): 414.12 ng/mL (FEU) (ref 0.00–499.00)

## 2018-02-12 LAB — MAGNESIUM
Magnesium: 2.2 mg/dL (ref 1.7–2.4)
Magnesium: 2.1 mg/dL (ref 1.7–2.4)

## 2018-02-12 MED ORDER — LEVOTHYROXINE SODIUM 25 MCG PO TABS
25.0000 ug | ORAL_TABLET | Freq: Every day | ORAL | Status: DC
  Administered 2018-02-12: 25 ug via ORAL
  Filled 2018-02-12: qty 1

## 2018-02-12 MED ORDER — LEVOTHYROXINE SODIUM 25 MCG PO TABS: 25.0000 ug | ORAL_TABLET | Freq: Every day | ORAL | 0 refills | Status: DC

## 2018-02-12 MED ORDER — ENOXAPARIN SODIUM 40 MG/0.4ML ~~LOC~~ SOLN
40.0000 mg | SUBCUTANEOUS | Status: DC
  Administered 2018-02-12: 40 mg via SUBCUTANEOUS
  Filled 2018-02-12: qty 0.4

## 2018-02-12 MED ORDER — ACETAMINOPHEN 325 MG PO TABS: 650.0000 mg | ORAL_TABLET | Freq: Four times a day (QID) | ORAL | Status: DC | PRN

## 2018-02-12 MED ORDER — ONDANSETRON HCL 4 MG/2ML IJ SOLN: 4.0000 mg | Freq: Four times a day (QID) | INTRAMUSCULAR | Status: DC | PRN

## 2018-02-12 MED ORDER — ASPIRIN 81 MG PO CHEW
81.0000 mg | CHEWABLE_TABLET | Freq: Every day | ORAL | Status: DC
  Administered 2018-02-12: 81 mg via ORAL
  Filled 2018-02-12: qty 1

## 2018-02-12 MED ORDER — ONDANSETRON HCL 4 MG PO TABS: 4.0000 mg | ORAL_TABLET | Freq: Four times a day (QID) | ORAL | Status: DC | PRN

## 2018-02-12 MED ORDER — ACETAMINOPHEN 650 MG RE SUPP: 650.0000 mg | Freq: Four times a day (QID) | RECTAL | Status: DC | PRN

## 2018-02-12 NOTE — Consult Note (Signed)
[facility] Cardiology  CARDIOLOGY CONSULT NOTE  Patient ID: Karen Reed MRN: [id number] DOB/AGE: 06/10/1960 58 y.o.  Admit date: 02/11/2018 Referring Physician Enedina Finner, MD Primary Physician [facility] Primary Cardiologist [facility] Reason for Consultation Palpitations  HPI: 58 year old female referred for evaluation of palpitations. The patient has a history of asthma and Sjogren's syndrome, and denies a known history of coronary artery disease. She reports experiencing brief episodes of palpitations for the last 3 days, occasionally with associated lightheadedness, without shortness of breath or chest pain. She describes the palpitations as feeling like she has been startled with subsequent heart racing, lasting a few seconds. She presented to [facility] ER because the palpitations lasted a couple of minutes, longer than they previously had, with associated lightheadedness. Of note, she experienced a similar episode 2 weeks ago while walking into her daughter's school, with associated shortness of breath; she stood still for a few minutes until the episode resolved. In the ER, ECG revealed sinus tachycardia at a rate of 108 bpm with pulmonary disease pattern. Admission labs notable for troponin less than 0.03 and elevated TSH of 6.184 without known thyroid disease. The patient was started on levothyroxine. Uploaded telemetry monitoring has revealed normal sinus rhythm. Chest xray was unremarkable. D-dimer is within normal limits. Electrolytes within normal limits. No evidence of anemia. Of note, the patient was evaluated at [facility] ER in 05/2017 for chest pain, ruled out for MI, and underwent a nuclear stress test as outpatient, which revealed normal left ventricular function without evidence of ischemia. 2D echocardiogram revealed LVEF 55-60%. Currently, the patient reports feeling fine. She reports having brief palpitations last night. She reports a recent history of bilateral pedal edema, which resolves overnight,  which is relatively new for her.  Review of systems complete and found to be negative unless listed above     Past Medical History:  Diagnosis Date  . Asthma    Bronchitis induced  . H/O seasonal allergies   . Sjogren's disease [facility])     Past Surgical History:  Procedure Laterality Date  . NO PAST SURGERIES      Medications Prior to Admission  Medication Sig Dispense Refill Last Dose  . amoxicillin-clavulanate (AUGMENTIN) 875-125 MG tablet Take 1 tablet by mouth every 12 (twelve) hours. (Patient not taking: Reported on 02/12/2018) 14 tablet 0 Completed Course at Unknown time   Social History   Socioeconomic History  . Marital status: Single    Spouse name: Not on file  . Number of children: Not on file  . Years of education: Not on file  . Highest education level: Not on file  Occupational History  . Not on file  Social Needs  . Financial resource strain: Not on file  . Food insecurity:    Worry: Not on file    Inability: Not on file  . Transportation needs:    Medical: Not on file    Non-medical: Not on file  Tobacco Use  . Smoking status: Current Every Day Smoker    Packs/day: 1.00    Types: Cigarettes  . Smokeless tobacco: Never Used  Substance and Sexual Activity  . Alcohol use: No  . Drug use: No  . Sexual activity: Not on file  Lifestyle  . Physical activity:    Days per week: Not on file    Minutes per session: Not on file  . Stress: Not on file  Relationships  . Social connections:    Talks on phone: Not on file  Gets together: Not on file    Attends religious service: Not on file    Active member of club or organization: Not on file    Attends meetings of clubs or organizations: Not on file    Relationship status: Not on file  . Intimate partner violence:    Fear of current or ex partner: Not on file    Emotionally abused: Not on file    Physically abused: Not on file    Forced sexual activity: Not on file  Other Topics Concern  . Not on  file  Social History Narrative  . Not on file    Family History  Problem Relation Age of Onset  . Heart failure Father   . Hypertension Father       Review of systems complete and found to be negative unless listed above      PHYSICAL EXAM  General: Well developed, well nourished, in no acute distress, sitting upright in bed. Smiling. HEENT:  Normocephalic and atramatic Neck:  No JVD.  Lungs: Clear bilaterally to auscultation and percussion. Heart: HRRR . Normal S1 and S2 without gallops or murmurs.  Abdomen: nondistended Msk:  Back normal. Normal strength and tone for age. Extremities: No clubbing, cyanosis or edema.   Neuro: Alert and oriented X 3. Psych:  Good affect, responds appropriately  Labs:   Lab Results  Component Value Date   WBC 7.0 02/11/2018   HGB 13.7 02/11/2018   HCT 42.4 02/11/2018   MCV 89.3 02/11/2018   PLT 378 02/11/2018    Recent Labs  Lab 02/11/18 2124  NA 138  K 3.7  CL 102  CO2 28  BUN 14  CREATININE 0.96  CALCIUM 9.1  PROT 8.4*  BILITOT 0.6  ALKPHOS 81  ALT 23  AST 20  GLUCOSE 95   Lab Results  Component Value Date   TROPONINI <0.03 02/12/2018   No results found for: CHOL No results found for: HDL No results found for: LDLCALC No results found for: TRIG No results found for: CHOLHDL No results found for: LDLDIRECT    Radiology: Dg Chest 2 View  Result Date: 02/11/2018 CLINICAL DATA:  Chest pain EXAM: CHEST - 2 VIEW COMPARISON:  07/21/2017 FINDINGS: Heart and mediastinal contours are within normal limits. No focal opacities or effusions. No acute bony abnormality. IMPRESSION: No active cardiopulmonary disease. Electronically Signed   By: Charlett Nose M.D.   On: 02/11/2018 21:56    EKG: Sinus rhythm  ASSESSMENT AND PLAN:  1. Palpitations with heart racing in the setting of new-onset hypothyroidism with TSH 6.184, started on Synthroid. ECG revealed sinus tachycardia at a rate of 108 bpm with pulmonary disease  pattern. 2. Asthma 3. Hypothyroidism, new onset  Recommendations: 1. Continue to monitor on telemetry 2. Continue Synthroid 3. 2D echocardiogram 4. If echocardiogram is overall unremarkable, and patient remains stable and minimally symptomatic, recommend discharge with scheduled follow-up with [facility] for Holter monitor placement. Patient will need to follow-up with PCP for continued management of thyroid disease.   Signed: Leanora Ivanoff PA-C 02/12/2018, 8:14 AM

## 2018-02-12 NOTE — Progress Notes (Signed)
Advanced care plan. Purpose of the Encounter: CODE STATUS Parties in Attendance: Patient Patient's Decision Capacity: Good Subjective/Patient's story: Presented to emergency room for palpitations Objective/Medical story As hypothyroidism and needs evaluation for palpitations and possibly Holter monitoring as outpatient Check echocardiogram Goals of care determination:  Advance care directives goals of care and treatment plan discussed Patient wants everything done which includes CPR, intubation ventilator the need arises CODE STATUS: Full code Time spent discussing advanced care planning: 16 minutes

## 2018-02-12 NOTE — Discharge Summary (Signed)
[facility] - [place] at [facility]   PATIENT NAME: Karen Reed    MR#:  [id number]  DATE OF BIRTH:  19-Dec-1960  DATE OF ADMISSION:  02/11/2018 ADMITTING PHYSICIAN: Enedina Finner, MD  DATE OF DISCHARGE: 02/12/2018  PRIMARY CARE PHYSICIAN: [facility]   ADMISSION DIAGNOSIS:  Palpitations [R00.2]  DISCHARGE DIAGNOSIS:  Palpitations Hypothyroidism  SECONDARY DIAGNOSIS:   Past Medical History:  Diagnosis Date  . Asthma    Bronchitis induced  . H/O seasonal allergies   . Sjogren's disease (HCC)      ADMITTING HISTORY Karen Reed  is a 58 y.o. female with a known history of asthma comes to the emergency room after she started noticing palpitations on and off lasting about 5 to 7 minutes at home. Patient said she had some dental work done yesterday got some Benadryl thereafter started noticing these episodes. No aggravating or relieving factors. Came to the emergency room she was tachycardic in the 100s. She is currently during my evaluation sinus rhythm 84 to 85 with normal blood pressure. ER physician Dr. Manson Passey could have silent episodes of V tach. Requesting medicine admit and have cardiology see patient and consider Holter monitor.   HOSPITAL COURSE:  Patient was admitted to telemetry and monitored.  Her heart rate normalized and was in sinus rhythm.  She was evaluated by cardiology.  They recommended outpatient Holter monitor.  TSH level was high.  Patient started on Synthroid.  Patient was worked up with echocardiogram.  She will be discharged home and follow-up with cardiology as outpatient and will be hooked up with outpatient Holter monitoring and will be arranged by cardiology. CONSULTS OBTAINED:  Treatment Team:  Marcina Millard, MD  DRUG ALLERGIES:   Allergies  Allergen Reactions  . Atorvastatin Other (See Comments)    Reaction: Joint pain  . Biaxin [Clarithromycin] Other (See Comments)    Reaction: unknown  . Codeine Hives  .  Doxycycline Other (See Comments)    Weak and tingling in face  . Levaquin [Levofloxacin] Other (See Comments)    Joint Pain  . Sulfa Antibiotics Hives and Swelling    DISCHARGE MEDICATIONS:   Allergies as of 02/12/2018      Reactions   Atorvastatin Other (See Comments)   Reaction: Joint pain   Biaxin [clarithromycin] Other (See Comments)   Reaction: unknown   Codeine Hives   Doxycycline Other (See Comments)   Weak and tingling in face   Levaquin [levofloxacin] Other (See Comments)   Joint Pain   Sulfa Antibiotics Hives, Swelling      Medication List    STOP taking these medications   amoxicillin-clavulanate 875-125 MG tablet Commonly known as:  AUGMENTIN     TAKE these medications   levothyroxine 25 MCG tablet Commonly known as:  SYNTHROID, LEVOTHROID Take 1 tablet (25 mcg total) by mouth daily before breakfast for 30 days. Start taking on:  February 13, 2018       Today  Patient seen today No chest pain No palpitations Tolerating diet well Hemodynamically stable  VITAL SIGNS:  Blood pressure 104/70, pulse 77, temperature 98.4 F (36.9 C), temperature source Oral, resp. rate 19, height 5\' 7"  (1.702 m), weight 101.8 kg, SpO2 97 %.  I/O:  No intake or output data in the 24 hours ending 02/12/18 1155  PHYSICAL EXAMINATION:  Physical Exam  GENERAL:  58 y.o.-year-old patient lying in the bed with no acute distress.  LUNGS: Normal breath sounds bilaterally, no wheezing, rales,rhonchi or crepitation. No  use of accessory muscles of respiration.  CARDIOVASCULAR: S1, S2 normal. No murmurs, rubs, or gallops.  ABDOMEN: Soft, non-tender, non-distended. Bowel sounds present. No organomegaly or mass.  NEUROLOGIC: Moves all 4 extremities. PSYCHIATRIC: The patient is alert and oriented x 3.  SKIN: No obvious rash, lesion, or ulcer.   DATA REVIEW:   CBC Recent Labs  Lab 02/11/18 2124  WBC 7.0  HGB 13.7  HCT 42.4  PLT 378    Chemistries  Recent Labs  Lab  02/11/18 2124 02/12/18 0315  NA 138  --   K 3.7  --   CL 102  --   CO2 28  --   GLUCOSE 95  --   BUN 14  --   CREATININE 0.96  --   CALCIUM 9.1  --   MG 2.2 2.1  AST 20  --   ALT 23  --   ALKPHOS 81  --   BILITOT 0.6  --     Cardiac Enzymes Recent Labs  Lab 02/12/18 0901  TROPONINI <0.03    Microbiology Results  Results for orders placed or performed during the hospital encounter of 01/10/16  Rapid strep screen     Status: None   Collection Time: 01/10/16  3:36 PM  Result Value Ref Range Status   Streptococcus, Group A Screen (Direct) NEGATIVE NEGATIVE Final    Comment: (NOTE) A Rapid Antigen test may result negative if the antigen level in the sample is below the detection level of this test. The FDA has not cleared this test as a stand-alone test therefore the rapid antigen negative result has reflexed to a Group A Strep culture.   Culture, group A strep     Status: None   Collection Time: 01/10/16  3:36 PM  Result Value Ref Range Status   Specimen Description THROAT  Final   Special Requests NONE Reflexed from [id number]  Final   Culture   Final    NO GROUP A STREP (S.PYOGENES) ISOLATED Performed at [facility]    Report Status 01/13/2016 FINAL  Final    RADIOLOGY:  Dg Chest 2 View  Result Date: 02/11/2018 CLINICAL DATA:  Chest pain EXAM: CHEST - 2 VIEW COMPARISON:  07/21/2017 FINDINGS: Heart and mediastinal contours are within normal limits. No focal opacities or effusions. No acute bony abnormality. IMPRESSION: No active cardiopulmonary disease. Electronically Signed   By: Charlett Nose M.D.   On: 02/11/2018 21:56    Follow up with PCP in 1 week.  Management plans discussed with the patient, family and they are in agreement.  CODE STATUS: Full code    Code Status Orders  (From admission, onward)         Start     Ordered   02/12/18 0257  Full code  Continuous     02/12/18 0256        Code Status History    This patient has a current  code status but no historical code status.      TOTAL TIME TAKING CARE OF THIS PATIENT ON DAY OF DISCHARGE: more than 37 minutes.   Ihor Austin M.D on 02/12/2018 at 11:55 AM  Between 7am to 6pm - Pager - [phone]  After 6pm go to [email or web address] - password [email or web address]  [facility]  Office  [phone]  CC: Primary care physician; [facility]  Note: This dictation was prepared with Dragon dictation along with smaller phrase technology. Any transcriptional errors that result from this process are  unintentional.

## 2018-02-12 NOTE — ED Provider Notes (Signed)
[facility] Emergency Department Provider Note _______   First MD Initiated Contact with Patient 02/11/18 2354     (approximate)  I have reviewed the triage vital signs and the nursing notes.   HISTORY  Chief Complaint Palpitations    HPI Karen Reed is a 58 y.o. female with below list of chronic medical conditions sent to the emergency department with rapid heartbeat intermittently for the past 2 days with no clear provocating factor.  Patient states that she can simply "roll over in bed and then it starts racing.  Patient states that episodes last for as long as 10 minutes followed by spontaneous resolution.  Patient denies any chest pain no shortness of breath no nausea or vomiting dizziness or diaphoresis with the episodes.  Patient denies any lower extremity pain or swelling.  Past Medical History:  Diagnosis Date  . Asthma    Bronchitis induced  . H/O seasonal allergies   . Sjogren's disease (HCC)     There are no active problems to display for this patient.   Past Surgical History:  Procedure Laterality Date  . NO PAST SURGERIES      Prior to Admission medications   Medication Sig Start Date End Date Taking? Authorizing Provider  amoxicillin-clavulanate (AUGMENTIN) 875-125 MG tablet Take 1 tablet by mouth every 12 (twelve) hours. 01/29/18   Tommie Sams, DO  aspirin 81 MG chewable tablet Chew by mouth daily.    [provider]    Allergies Biaxin [clarithromycin]; Codeine; Doxycycline; Levaquin [levofloxacin]; and Sulfa antibiotics  Family History  Problem Relation Age of Onset  . Heart failure Father   . Hypertension Father     Social History Social History   Tobacco Use  . Smoking status: Current Every Day Smoker    Packs/day: 1.00    Types: Cigarettes  . Smokeless tobacco: Never Used  Substance Use Topics  . Alcohol use: No  . Drug use: No    Review of Systems Constitutional: No fever/chills Eyes: No  visual changes. ENT: No sore throat. Cardiovascular: Denies chest pain. Respiratory: Denies shortness of breath.  Positive for heart palpitation. Gastrointestinal: No abdominal pain.  No nausea, no vomiting.  No diarrhea.  No constipation. Genitourinary: Negative for dysuria. Musculoskeletal: Negative for neck pain.  Negative for back pain. Integumentary: Negative for rash. Neurological: Negative for headaches, focal weakness or numbness.   ____________________________________________   PHYSICAL EXAM:  VITAL SIGNS: ED Triage Vitals [02/11/18 2122]  Enc Vitals Group     BP (!) 146/88     Pulse Rate (!) 109     Resp 20     Temp 99 F (37.2 C)     Temp Source Oral     SpO2 100 %     Weight 104.3 kg (230 lb)     Height 1.702 m (5\' 7" )     Head Circumference      Peak Flow      Pain Score 0     Pain Loc      Pain Edu?      Excl. in GC?     Constitutional: Alert and oriented. Well appearing and in no acute distress. Eyes: Conjunctivae are normal. PERRL. EOMI. Head: Atraumatic. Mouth/Throat: Mucous membranes are moist.  Oropharynx non-erythematous. Neck: No stridor.   Cardiovascular: Normal rate, regular rhythm. Good peripheral circulation. Grossly normal heart sounds. Respiratory: Normal respiratory effort.  No retractions. Lungs CTAB. Gastrointestinal: Soft and nontender. No distention.  Musculoskeletal: No lower  extremity tenderness nor edema. No gross deformities of extremities. Neurologic:  Normal speech and language. No gross focal neurologic deficits are appreciated.  Skin:  Skin is warm, dry and intact. No rash noted. Psychiatric: Mood and affect are normal. Speech and behavior are normal.  ____________________________________________   LABS (all labs ordered are listed, but only abnormal results are displayed)  Labs Reviewed  COMPREHENSIVE METABOLIC PANEL - Abnormal; Notable for the following components:      Result Value   Total Protein 8.4 (*)    All  other components within normal limits  CBC  TROPONIN I  FIBRIN DERIVATIVES D-DIMER ([facility] ONLY)  TSH   ____________________________________________  EKG  ED ECG REPORT I, [place] N Eliabeth Shoff, the attending physician, personally viewed and interpreted this ECG.   Date: 02/12/2018  EKG Time: 9:24 PM  Rate: 108  Rhythm: Sinus tachycardia  Axis: Normal  Intervals: Normal  ST&T Change: None  ____________________________________________  RADIOLOGY I, [place] Mattix Imhof, personally viewed and evaluated these images (plain radiographs) as part of my medical decision making, as well as reviewing the written report by the radiologist.  ED MD interpretation: No active cardiopulmonary disease  Official radiology report(s): Dg Chest 2 View  Result Date: 02/11/2018 CLINICAL DATA:  Chest pain EXAM: CHEST - 2 VIEW COMPARISON:  07/21/2017 FINDINGS: Heart and mediastinal contours are within normal limits. No focal opacities or effusions. No acute bony abnormality. IMPRESSION: No active cardiopulmonary disease. Electronically Signed   By: Charlett Nose M.D.   On: 02/11/2018 21:56      Procedures   ____________________________________________   INITIAL IMPRESSION / ASSESSMENT AND PLAN / ED COURSE  As part of my medical decision making, I reviewed the following data within the electronic MEDICAL RECORD NUMBER   58 year old female presenting with above-stated history and physical exam secondary to intermittent irregular rapid heartbeat.  While evaluating the patient she had premature ventricular contraction to which she admits that that is what she feels at home however not is brief stating again that it occurs for as long as 10 minutes plus.  Raising the possibility of nonsustained ventricular tachycardia or bigeminy etc.  Patient discussed with Dr. Allena Katz hospitalist for hospital admission for cardiac monitoring and further evaluation. ____________________________________________  FINAL CLINICAL  IMPRESSION(S) / ED DIAGNOSES  Final diagnoses:  Palpitations     MEDICATIONS GIVEN DURING THIS VISIT:  Medications - No data to display   ED Discharge Orders    None       Note:  This document was prepared using Dragon voice recognition software and may include unintentional dictation errors.    Darci Current, MD 02/12/18 [phone]

## 2018-02-12 NOTE — H&P (Signed)
[facility] - [place] at [facility]   PATIENT NAME: Karen Reed    MR#:  [id number]  DATE OF BIRTH:  December 01, 1960  DATE OF ADMISSION:  02/11/2018  PRIMARY CARE PHYSICIAN: [facility]   REQUESTING/REFERRING PHYSICIAN: Dr. Manson Passey  CHIEF COMPLAINT:  amputation since yesterday  HISTORY OF PRESENT ILLNESS:  Karen Reed  is a 58 y.o. female with a known history of asthma comes to the emergency room after she started noticing palpitations on and off lasting about 5 to 7 minutes at home. Patient said she had some dental work done yesterday got some Benadryl thereafter started noticing these episodes. No aggravating or relieving factors. Came to the emergency room she was tachycardic in the 100s. She is currently during my evaluation sinus rhythm 84 to 85 with normal blood pressure.  ER physician Dr. Manson Passey could have silent episodes of V tach. Requesting medicine admit and have cardiology see patient and consider Holter monitor.  PAST MEDICAL HISTORY:   Past Medical History:  Diagnosis Date  . Asthma    Bronchitis induced  . H/O seasonal allergies   . Sjogren's disease (HCC)     PAST SURGICAL HISTOIRY:   Past Surgical History:  Procedure Laterality Date  . NO PAST SURGERIES      SOCIAL HISTORY:   Social History   Tobacco Use  . Smoking status: Current Every Day Smoker    Packs/day: 1.00    Types: Cigarettes  . Smokeless tobacco: Never Used  Substance Use Topics  . Alcohol use: No    FAMILY HISTORY:   Family History  Problem Relation Age of Onset  . Heart failure Father   . Hypertension Father     DRUG ALLERGIES:   Allergies  Allergen Reactions  . Atorvastatin Other (See Comments)    Reaction: Joint pain  . Biaxin [Clarithromycin] Other (See Comments)    Reaction: unknown  . Codeine Hives  . Doxycycline Other (See Comments)    Weak and tingling in face  . Levaquin [Levofloxacin] Other (See Comments)    Joint Pain  .  Sulfa Antibiotics Hives and Swelling    REVIEW OF SYSTEMS:  Review of Systems  Constitutional: Negative for chills, fever and weight loss.  HENT: Negative for ear discharge, ear pain and nosebleeds.   Eyes: Negative for blurred vision, pain and discharge.  Respiratory: Negative for sputum production, shortness of breath, wheezing and stridor.   Cardiovascular: Positive for palpitations. Negative for chest pain, orthopnea and PND.  Gastrointestinal: Negative for abdominal pain, diarrhea, nausea and vomiting.  Genitourinary: Negative for frequency and urgency.  Musculoskeletal: Negative for back pain and joint pain.  Neurological: Negative for sensory change, speech change, focal weakness and weakness.  Psychiatric/Behavioral: Negative for depression and hallucinations. The patient is not nervous/anxious.      MEDICATIONS AT HOME:   Prior to Admission medications   Medication Sig Start Date End Date Taking? Authorizing Provider  amoxicillin-clavulanate (AUGMENTIN) 875-125 MG tablet Take 1 tablet by mouth every 12 (twelve) hours. Patient not taking: Reported on 02/12/2018 01/29/18   Tommie Sams, DO  aspirin 81 MG chewable tablet Chew by mouth daily.    [provider]  CHANTIX 0.5 MG tablet Take 0.5 mg by mouth as directed. TAKE 1 TAB DAILY X3 DAYS,INCREASE TO 1 TAB TWICE DAILY X4 DAYS,THEN START CHANTIX 1 MG DOSE 02/04/18   [provider]  CHANTIX 1 MG tablet Take 1 mg by mouth 2 (two) times daily. 02/04/18  [provider]  citalopram (CELEXA) 10 MG tablet Take 10 mg by mouth daily. 02/04/18   [provider]  meloxicam (MOBIC) 15 MG tablet Take 15 mg by mouth daily. 01/29/18   [provider]  nicotine (NICODERM CQ - DOSED IN MG/24 HOURS) 21 mg/24hr patch Place 1 patch onto the skin daily. 02/04/18   [provider]      VITAL SIGNS:  Blood pressure 114/81, pulse 84, temperature 99 F (37.2 C), temperature source Oral, resp. rate  (!) 27, height 5\' 7"  (1.702 m), weight 104.3 kg, SpO2 98 %.  PHYSICAL EXAMINATION:  GENERAL:  58 y.o.-year-old patient lying in the bed with no acute distress.  EYES: Pupils equal, round, reactive to light and accommodation. No scleral icterus. Extraocular muscles intact.  HEENT: Head atraumatic, normocephalic. Oropharynx and nasopharynx clear.  NECK:  Supple, no jugular venous distention. No thyroid enlargement, no tenderness.  LUNGS: Normal breath sounds bilaterally, no wheezing, rales,rhonchi or crepitation. No use of accessory muscles of respiration.  CARDIOVASCULAR: S1, S2 normal. No murmurs, rubs, or gallops.  ABDOMEN: Soft, nontender, nondistended. Bowel sounds present. No organomegaly or mass.  EXTREMITIES: No pedal edema, cyanosis, or clubbing.  NEUROLOGIC: Cranial nerves II through XII are intact. Muscle strength 5/5 in all extremities. Sensation intact. Gait not checked.  PSYCHIATRIC: The patient is alert and oriented x 3.  SKIN: No obvious rash, lesion, or ulcer.   LABORATORY PANEL:   CBC Recent Labs  Lab 02/11/18 2124  WBC 7.0  HGB 13.7  HCT 42.4  PLT 378   ------------------------------------------------------------------------------------------------------------------  Chemistries  Recent Labs  Lab 02/11/18 2124  NA 138  K 3.7  CL 102  CO2 28  GLUCOSE 95  BUN 14  CREATININE 0.96  CALCIUM 9.1  MG 2.2  AST 20  ALT 23  ALKPHOS 81  BILITOT 0.6   ------------------------------------------------------------------------------------------------------------------  Cardiac Enzymes Recent Labs  Lab 02/11/18 2124  TROPONINI <0.03   ------------------------------------------------------------------------------------------------------------------  RADIOLOGY:  Dg Chest 2 View  Result Date: 02/11/2018 CLINICAL DATA:  Chest pain EXAM: CHEST - 2 VIEW COMPARISON:  07/21/2017 FINDINGS: Heart and mediastinal contours are within normal limits. No focal opacities or  effusions. No acute bony abnormality. IMPRESSION: No active cardiopulmonary disease. Electronically Signed   By: Charlett NoseKevin  Dover M.D.   On: 02/11/2018 21:56    EKG:   Sinus tachycardia heart rate 108 IMPRESSION AND PLAN:   Karen Reed  is a 58 y.o. female with a known history of asthma comes to the emergency room after she started noticing palpitations on and off lasting about 5 to 7 minutes at home.  1. Palpitation -admit to telemetry -heart rate currently normal -cardiology consultation with Dr. Lady GaryFath message sent -Echo -consider Holter monitor as outpatient if cardiology feels deemed necessary  2. Hypothyroidism-- new onset -start Synthroid and patient recommended follow-up with PCP for repeat PSA check in 4-5 weeks  3. DVT prophylaxis subcu Lovenox   All the records are reviewed and case discussed with ED provider.   CODE STATUS: full  TOTAL TIME TAKING CARE OF THIS PATIENT: *45* minutes.    Enedina FinnerSona Terriana Barreras M.D on 02/12/2018 at 1:17 AM  Between 7am to 6pm - Pager - [phone]  After 6pm go to [email or web address] - password [email or web address] [email or web address]  [facility]  Office  [phone]  CC: Primary care physician; [facility]

## 2018-02-12 NOTE — ED Notes (Signed)
ED TO INPATIENT HANDOFF REPORT  Name/Age/Gender Karen Reed 58 y.o. female  Code Status   Home/SNF/Other Home  Chief Complaint chest fluttering, dizziness  Level of Care/Admitting Diagnosis ED Disposition    ED Disposition Condition Comment   Admit  Hospital Area: [facility] [[id number]]  Level of Care: Telemetry [5]  Diagnosis: Palpitations [785.1.ICD-9-CM]  Admitting Physician: Enedina Finner [[id number]]  Attending Physician: Enedina Finner [[id number]]  PT Class (Do Not Modify): Observation [104]  PT Acc Code (Do Not Modify): Observation [10022]       Medical History Past Medical History:  Diagnosis Date  . Asthma    Bronchitis induced  . H/O seasonal allergies   . Sjogren's disease (HCC)     Allergies Allergies  Allergen Reactions  . Atorvastatin Other (See Comments)    Reaction: Joint pain  . Biaxin [Clarithromycin] Other (See Comments)    Reaction: unknown  . Codeine Hives  . Doxycycline Other (See Comments)    Weak and tingling in face  . Levaquin [Levofloxacin] Other (See Comments)    Joint Pain  . Sulfa Antibiotics Hives and Swelling    IV Location/Drains/Wounds Patient Lines/Drains/Airways Status   Active Line/Drains/Airways    Name:   Placement date:   Placement time:   Site:   Days:   Peripheral IV 02/12/18 Left Forearm   02/12/18    0147    Forearm   less than 1          Labs/Imaging Results for orders placed or performed during the hospital encounter of 02/11/18 (from the past 48 hour(s))  CBC     Status: None   Collection Time: 02/11/18  9:24 PM  Result Value Ref Range   WBC 7.0 4.0 - 10.5 K/uL   RBC 4.75 3.87 - 5.11 MIL/uL   Hemoglobin 13.7 12.0 - 15.0 g/dL   HCT 84.6 65.9 - 93.5 %   MCV 89.3 80.0 - 100.0 fL   MCH 28.8 26.0 - 34.0 pg   MCHC 32.3 30.0 - 36.0 g/dL   RDW 70.1 77.9 - 39.0 %   Platelets 378 150 - 400 K/uL   nRBC 0.0 0.0 - 0.2 %    Comment: Performed at [facility], [address]  [place], [place] [address]  Comprehensive metabolic panel     Status: Abnormal   Collection Time: 02/11/18  9:24 PM  Result Value Ref Range   Sodium 138 135 - 145 mmol/L   Potassium 3.7 3.5 - 5.1 mmol/L   Chloride 102 98 - 111 mmol/L   CO2 28 22 - 32 mmol/L   Glucose, Bld 95 70 - 99 mg/dL   BUN 14 6 - 20 mg/dL   Creatinine, Ser 3.30 0.44 - 1.00 mg/dL   Calcium 9.1 8.9 - 07.6 mg/dL   Total Protein 8.4 (H) 6.5 - 8.1 g/dL   Albumin 4.0 3.5 - 5.0 g/dL   AST 20 15 - 41 U/L   ALT 23 0 - 44 U/L   Alkaline Phosphatase 81 38 - 126 U/L   Total Bilirubin 0.6 0.3 - 1.2 mg/dL   GFR calc non Af Amer >60 >60 mL/min   GFR calc Af Amer >60 >60 mL/min   Anion gap 8 5 - 15    Comment: Performed at [facility], [address] [place], [place] [address]  Troponin I - ONCE - STAT     Status: None   Collection Time: 02/11/18  9:24 PM  Result Value Ref  Range   Troponin I <0.03 <0.03 ng/mL    Comment: Performed at [facility], [address] [place], [place] [address]  TSH     Status: Abnormal   Collection Time: 02/11/18  9:24 PM  Result Value Ref Range   TSH 6.184 (H) 0.350 - 4.500 uIU/mL    Comment: Performed by a 3rd Generation assay with a functional sensitivity of <=0.01 uIU/mL. Performed at [facility], [address] [place], [place] [address]   Magnesium     Status: None   Collection Time: 02/11/18  9:24 PM  Result Value Ref Range   Magnesium 2.2 1.7 - 2.4 mg/dL    Comment: Performed at [facility], [address] [place], [place] [address]  Fibrin derivatives D-Dimer     Status: None   Collection Time: 02/12/18 12:04 AM  Result Value Ref Range   Fibrin derivatives D-dimer ([facility]) 414.12 0.00 - 499.00 ng/mL (FEU)    Comment: (NOTE) <> Exclusion of Venous Thromboembolism (VTE) - OUTPATIENT ONLY   (Emergency Department or [place])   0-499 ng/ml (FEU): With a low to intermediate pretest probability                      for VTE this test result  excludes the diagnosis                      of VTE.   >499 ng/ml (FEU) : VTE not excluded; additional work up for VTE is                      required. <> Testing on Inpatients and Evaluation of Disseminated Intravascular   Coagulation (DIC) Reference Range:   0-499 ng/ml (FEU) Performed at [facility], [address] [place], [place] [address]    Dg Chest 2 View  Result Date: 02/11/2018 CLINICAL DATA:  Chest pain EXAM: CHEST - 2 VIEW COMPARISON:  07/21/2017 FINDINGS: Heart and mediastinal contours are within normal limits. No focal opacities or effusions. No acute bony abnormality. IMPRESSION: No active cardiopulmonary disease. Electronically Signed   By: Charlett Nose M.D.   On: 02/11/2018 21:56    Pending Labs Unresulted Labs (From admission, onward)    Start     Ordered   Signed and Held  CBC  (enoxaparin (LOVENOX)    CrCl >/= 30 ml/min)  Once,   R    Comments:  Baseline for enoxaparin therapy IF NOT ALREADY DRAWN.  Notify MD if PLT < 100 K.    Signed and Held   Signed and Held  Creatinine, serum  (enoxaparin (LOVENOX)    CrCl >/= 30 ml/min)  Once,   R    Comments:  Baseline for enoxaparin therapy IF NOT ALREADY DRAWN.    Signed and Held   Signed and Held  Creatinine, serum  (enoxaparin (LOVENOX)    CrCl >/= 30 ml/min)  Weekly,   R    Comments:  while on enoxaparin therapy    Signed and Held   Signed and Held  Magnesium  Add-on,   R     Signed and Held   Signed and Held  Troponin I - Now Then Q6H  Now then every 6 hours,   R     Signed and Held          Vitals/Pain Today's Vitals   02/11/18 2353 02/12/18 0030 02/12/18 0100 02/12/18 0140  BP: 115/76 119/76 114/81  126/79  Pulse: 94 84 84 87  Resp: 17 (!) 27 (!) 27 18  Temp:      TempSrc:      SpO2: 97% 98% 98% 96%  Weight:      Height:      PainSc:        Isolation Precautions No active isolations  Medications Medications - No data to display  Mobility walks

## 2018-02-12 NOTE — Progress Notes (Signed)
*  PRELIMINARY RESULTS* Echocardiogram 2D Echocardiogram has been performed.  Karen Reed 02/12/2018, 10:09 AM

## 2018-02-22 ENCOUNTER — Encounter: Payer: Self-pay | Admitting: Gynecology

## 2018-02-22 ENCOUNTER — Ambulatory Visit
Admission: EM | Admit: 2018-02-22 | Discharge: 2018-02-22 | Disposition: A | Discharge status: Home / Self Care | Payer: 59 | Attending: Family Medicine | Admitting: Family Medicine

## 2018-02-22 ENCOUNTER — Other Ambulatory Visit: Payer: Self-pay

## 2018-02-22 DIAGNOSIS — R0981 Nasal congestion: Secondary | ICD-10-CM

## 2018-02-22 DIAGNOSIS — J0100 Acute maxillary sinusitis, unspecified: Secondary | ICD-10-CM

## 2018-02-22 DIAGNOSIS — J302 Other seasonal allergic rhinitis: Secondary | ICD-10-CM

## 2018-02-22 DIAGNOSIS — R0982 Postnasal drip: Secondary | ICD-10-CM

## 2018-02-22 DIAGNOSIS — J019 Acute sinusitis, unspecified: Secondary | ICD-10-CM | POA: Diagnosis not present

## 2018-02-22 DIAGNOSIS — J309 Allergic rhinitis, unspecified: Secondary | ICD-10-CM

## 2018-02-22 DIAGNOSIS — F17210 Nicotine dependence, cigarettes, uncomplicated: Secondary | ICD-10-CM

## 2018-02-22 DIAGNOSIS — J0110 Acute frontal sinusitis, unspecified: Secondary | ICD-10-CM

## 2018-02-22 HISTORY — DX: Palpitations: R00.2

## 2018-02-22 MED ORDER — FLUCONAZOLE 150 MG PO TABS: 150.0000 mg | ORAL_TABLET | Freq: Once | ORAL | 0 refills | Status: AC

## 2018-02-22 MED ORDER — AMOXICILLIN-POT CLAVULANATE 875-125 MG PO TABS: 1.0000 | ORAL_TABLET | Freq: Two times a day (BID) | ORAL | 0 refills | Status: DC

## 2018-02-22 MED ORDER — FLUTICASONE PROPIONATE 50 MCG/ACT NA SUSP: 1.0000 | Freq: Every day | NASAL | 0 refills | Status: DC

## 2018-02-22 NOTE — ED Provider Notes (Signed)
[facility] ____________________________________________  Time seen: Approximately 5:37 PM  I have reviewed the triage vital signs and the nursing notes.   HISTORY  Chief Complaint Sinusitis   HPI Karen Reed is a 58 y.o. female presenting for evaluation approximately 1 month of nasal congestion, postnasal drainage, sinus pressure.  States sinus pressure particular in her cheeks, some in her forehead.  Occasional cough.  States getting a lot of nasal drainage out.  Does report that her seasonal allergies has been acting up some aggravating this.  Has continued to take her Claritin.  Denies accompanying fevers.  Denies chest pain or shortness of breath.  Continues to eat and drink well.  Denies other aggravating alleviating factors.  Mild to moderate sinus pain at this time.  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 Asthma    Bronchitis induced  . H/O seasonal allergies   . Heart palpitations   . Sjogren's disease [facility])     Patient Active Problem List   Diagnosis Date Noted  . Palpitations 02/12/2018    Past Surgical History:  Procedure Laterality Date  . NO PAST SURGERIES       No current facility-administered medications for this encounter.   Current Outpatient Medications:  .  fluticasone (FLONASE) 50 MCG/ACT nasal spray, 1 spray by Each Nare route daily., Disp: , Rfl:  .  loratadine (CLARITIN) 10 MG tablet, TAKE 1 TABLET BY MOUTH EVERY DAY, Disp: , Rfl:  .  amoxicillin-clavulanate (AUGMENTIN) 875-125 MG tablet, Take 1 tablet by mouth every 12 (twelve) hours., Disp: 20 tablet, Rfl: 0 .  fluconazole (DIFLUCAN) 150 MG tablet, Take 1 tablet (150 mg total) by mouth once for 1 dose. Take one pill orally as needed., Disp: 1 tablet, Rfl: 0 .  fluticasone (FLONASE) 50 MCG/ACT nasal spray, Place 1 spray into both nostrils daily., Disp: 1 g, Rfl: 0  Allergies Atorvastatin; Biaxin [clarithromycin]; Codeine; Doxycycline; Levaquin  [levofloxacin]; and Sulfa antibiotics  Family History  Problem Relation Age of Onset  . Heart failure Father   . Hypertension Father     Social History Social History   Tobacco Use  . Smoking status: Current Every Day Smoker    Packs/day: 1.00    Types: Cigarettes  . Smokeless tobacco: Never Used  Substance Use Topics  . Alcohol use: No  . Drug use: No    Review of Systems Constitutional: No fever ENT: No sore throat  As above Cardiovascular: Denies chest pain. Respiratory: Denies shortness of breath. Gastrointestinal: No abdominal pain.   Skin: Negative for rash. Neurological: Negative for focal weakness or numbness.   ____________________________________________   PHYSICAL EXAM:  VITAL SIGNS: ED Triage Vitals  Enc Vitals Group     BP 02/22/18 1504 125/88     Pulse Rate 02/22/18 1504 99     Resp 02/22/18 1504 18     Temp 02/22/18 1504 98.6 F (37 C)     Temp Source 02/22/18 1504 Oral     SpO2 02/22/18 1504 97 %     Weight 02/22/18 1503 224 lb 6.4 oz (101.8 kg)     Height 02/22/18 1503 5\' 7"  (1.702 m)     Head Circumference --      Peak Flow --      Pain Score 02/22/18 1505 7     Pain Loc --      Pain Edu? --      Excl. in GC? --  Constitutional: Alert and oriented. Well appearing and in no acute distress. Eyes: Conjunctivae are normal. Head: Atraumatic.Mild to moderate tenderness to palpation bilateral frontal and maxillary sinuses. No swelling. No erythema.   Ears: no erythema, normal TMs bilaterally.   Nose: nasal congestion with bilateral nasal turbinate erythema and edema.   Mouth/Throat: Mucous membranes are moist.  Oropharynx non-erythematous.No tonsillar swelling or exudate.  Posterior pharyngeal cobblestoning. Neck: No stridor.  No cervical spine tenderness to palpation. Hematological/Lymphatic/Immunilogical: No cervical lymphadenopathy. Cardiovascular: Normal rate, regular rhythm. Grossly normal heart sounds.  Good peripheral  circulation. Respiratory: Normal respiratory effort.  No retractions.  No wheezes, rales or rhonchi. Good air movement.  Musculoskeletal: Steady gait Neurologic:  Normal speech and language. No gross focal neurologic deficits are appreciated. No gait instability.  No paresthesias. Skin:  Skin is warm, dry and intact. No rash noted. Psychiatric: Mood and affect are normal. Speech and behavior are normal. ___________________________________________   LABS (all labs ordered are listed, but only abnormal results are displayed)  Labs Reviewed - No data to display ____________________________________________  PROCEDURES Procedures    INITIAL IMPRESSION / ASSESSMENT AND PLAN / ED COURSE  Pertinent labs & imaging results that were available during my care of the patient were reviewed by me and considered in my medical decision making (see chart for details).  Well-appearing patient.  No acute distress.  Suspect allergic rhinitis flareup, will also Rx Flonase.  Suspect secondary sinusitis.  Patient with multiple antibiotic allergies, states that she tolerates Augmentin well, Rx given.  Rx Diflucan given as needed peer encourage rest, fluids, supportive care.Discussed indication, risks and benefits of medications with patient.  Discussed follow up with Primary care physician this week. Discussed follow up and return parameters including no resolution or any worsening concerns. Patient verbalized understanding and agreed to plan.   ____________________________________________   FINAL CLINICAL IMPRESSION(S) / ED DIAGNOSES  Final diagnoses:  Acute frontal sinusitis, recurrence not specified  Acute maxillary sinusitis, recurrence not specified  Allergic rhinitis, unspecified seasonality, unspecified trigger     ED Discharge Orders         Ordered    fluticasone (FLONASE) 50 MCG/ACT nasal spray  Daily     02/22/18 1655    amoxicillin-clavulanate (AUGMENTIN) 875-125 MG tablet  Every 12  hours     02/22/18 1655    fluconazole (DIFLUCAN) 150 MG tablet   Once     02/22/18 1658           Note: This dictation was prepared with Dragon dictation along with smaller phrase technology. Any transcriptional errors that result from this process are unintentional.         Renford Dills, NP 02/22/18 [phone]

## 2018-02-22 NOTE — Discharge Instructions (Addendum)
Take medication as prescribed. Rest. Drink plenty of fluids.  ° °Follow up with your primary care physician this week as needed. Return to Urgent care for new or worsening concerns.  ° °

## 2018-02-22 NOTE — ED Triage Notes (Signed)
Patient c/o sinus problem x couple weeks.

## 2018-05-05 DIAGNOSIS — Z87828 Personal history of other (healed) physical injury and trauma: Secondary | ICD-10-CM | POA: Insufficient documentation

## 2018-11-17 ENCOUNTER — Encounter: Payer: Self-pay | Admitting: Emergency Medicine

## 2018-11-17 ENCOUNTER — Other Ambulatory Visit: Payer: Self-pay

## 2018-11-17 ENCOUNTER — Emergency Department
Admission: EM | Admit: 2018-11-17 | Discharge: 2018-11-17 | Disposition: A | Discharge status: Home / Self Care | Payer: 59 | Attending: Emergency Medicine | Admitting: Emergency Medicine

## 2018-11-17 ENCOUNTER — Emergency Department: Payer: 59

## 2018-11-17 DIAGNOSIS — Z79899 Other long term (current) drug therapy: Secondary | ICD-10-CM | POA: Insufficient documentation

## 2018-11-17 DIAGNOSIS — R079 Chest pain, unspecified: Secondary | ICD-10-CM | POA: Diagnosis present

## 2018-11-17 DIAGNOSIS — K2970 Gastritis, unspecified, without bleeding: Secondary | ICD-10-CM | POA: Insufficient documentation

## 2018-11-17 LAB — BASIC METABOLIC PANEL
Anion gap: 10 (ref 5–15)
BUN: 12 mg/dL (ref 6–20)
CO2: 27 mmol/L (ref 22–32)
Calcium: 9.2 mg/dL (ref 8.9–10.3)
Chloride: 104 mmol/L (ref 98–111)
Creatinine, Ser: 0.75 mg/dL (ref 0.44–1.00)
GFR calc Af Amer: >60 mL/min (ref >60)
GFR calc non Af Amer: >60 mL/min (ref >60)
Glucose, Bld: 93 mg/dL (ref 70–99)
Potassium: 3.8 mmol/L (ref 3.5–5.1)
Sodium: 141 mmol/L (ref 135–145)

## 2018-11-17 LAB — CBC
HCT: 38.4 % (ref 36.0–46.0)
Hemoglobin: 12.6 g/dL (ref 12.0–15.0)
MCH: 29.5 pg (ref 26.0–34.0)
MCHC: 32.8 g/dL (ref 30.0–36.0)
MCV: 89.9 fL (ref 80.0–100.0)
Platelets: 380 10*3/uL (ref 150–400)
RBC: 4.27 MIL/uL (ref 3.87–5.11)
RDW: 14.8 % (ref 11.5–15.5)
WBC: 9 10*3/uL (ref 4.0–10.5)
nRBC: 0 % (ref 0.0–0.2)

## 2018-11-17 LAB — TROPONIN I (HIGH SENSITIVITY): Troponin I (High Sensitivity): <2 ng/L (ref <18)

## 2018-11-17 MED ORDER — LIDOCAINE VISCOUS HCL 2 % MT SOLN
15.0000 mL | Freq: Once | OROMUCOSAL | Status: AC
  Administered 2018-11-17: 20:00:00 15 mL via OROMUCOSAL
  Filled 2018-11-17: qty 15

## 2018-11-17 MED ORDER — FAMOTIDINE 20 MG PO TABS: 20.0000 mg | ORAL_TABLET | Freq: Every day | ORAL | 1 refills | Status: DC

## 2018-11-17 MED ORDER — SODIUM CHLORIDE 0.9% FLUSH: 3.0000 mL | Freq: Once | INTRAVENOUS | Status: DC

## 2018-11-17 MED ORDER — SUCRALFATE 1 G PO TABS: 1.0000 g | ORAL_TABLET | Freq: Four times a day (QID) | ORAL | 0 refills | Status: DC

## 2018-11-17 NOTE — ED Provider Notes (Signed)
[facility] Emergency Department Provider Note   ____________________________________________   I have reviewed the triage vital signs and the nursing notes.   HISTORY  Chief Complaint Chest Pain   History limited by: Not Limited   HPI Karen Reed is a 58 y.o. female who presents to the emergency department today because of concerns for chest pain.  The patient stated that the pain started this morning.  Located in the left side.  It does have some radiation into her back.  She is also noticed some heaviness to her left arm.  Patient describes the pain as a dull sharp pain.  She denies any associated shortness of breath.  No nausea or vomiting.  Patient denies similar symptoms in the past.  No recent fevers. Had some grapefruit yesterday which was unusual for her  Records reviewed. Per medical record review patient has a history of palpitations, sjogren's.  Past Medical History:  Diagnosis Date  . Asthma    Bronchitis induced  . H/O seasonal allergies   . Heart palpitations   . Sjogren's disease [facility])     Patient Active Problem List   Diagnosis Date Noted  . Palpitations 02/12/2018    Past Surgical History:  Procedure Laterality Date  . NO PAST SURGERIES      Prior to Admission medications   Medication Sig Start Date End Date Taking? Authorizing Provider  amoxicillin-clavulanate (AUGMENTIN) 875-125 MG tablet Take 1 tablet by mouth every 12 (twelve) hours. 02/22/18   Marylene Land, NP  fluticasone (FLONASE) 50 MCG/ACT nasal spray 1 spray by Each Nare route daily. 02/13/18 02/13/19  [provider]  fluticasone (FLONASE) 50 MCG/ACT nasal spray Place 1 spray into both nostrils daily. 02/22/18   Marylene Land, NP  loratadine (CLARITIN) 10 MG tablet TAKE 1 TABLET BY MOUTH EVERY DAY 02/17/18   [provider]    Allergies Atorvastatin, Biaxin [clarithromycin], Codeine, Doxycycline, Levaquin [levofloxacin], and Sulfa  antibiotics  Family History  Problem Relation Age of Onset  . Heart failure Father   . Hypertension Father     Social History Social History   Tobacco Use  . Smoking status: Current Every Day Smoker    Packs/day: 1.00    Types: Cigarettes  . Smokeless tobacco: Never Used  Substance Use Topics  . Alcohol use: No  . Drug use: No    Review of Systems Constitutional: No fever/chills Eyes: No visual changes. ENT: No sore throat. Cardiovascular: Positive for left chest pain. Respiratory: Denies shortness of breath. Gastrointestinal: No abdominal pain.  No nausea, no vomiting.  No diarrhea.   Genitourinary: Negative for dysuria. Musculoskeletal: Positive for back pain Skin: Negative for rash. Neurological: Negative for headaches, focal weakness or numbness.  ____________________________________________   PHYSICAL EXAM:  VITAL SIGNS: ED Triage Vitals  Enc Vitals Group     BP 11/17/18 1739 (!) 146/79     Pulse Rate 11/17/18 1739 96     Resp 11/17/18 1739 18     Temp 11/17/18 1739 99.4 F (37.4 C)     Temp Source 11/17/18 1739 Oral     SpO2 11/17/18 1739 99 %     Weight 11/17/18 1740 219 lb (99.3 kg)     Height 11/17/18 1740 5\' 7"  (1.702 m)     Head Circumference --      Peak Flow --      Pain Score 11/17/18 1747 6   Constitutional: Alert and oriented.  Eyes: Conjunctivae are normal.  ENT  Head: Normocephalic and atraumatic.      Nose: No congestion/rhinnorhea.      Mouth/Throat: Mucous membranes are moist.      Neck: No stridor. Hematological/Lymphatic/Immunilogical: No cervical lymphadenopathy. Cardiovascular: Normal rate, regular rhythm.  No murmurs, rubs, or gallops.  Respiratory: Normal respiratory effort without tachypnea nor retractions. Breath sounds are clear and equal bilaterally. No wheezes/rales/rhonchi. Gastrointestinal: Soft and non tender. No rebound. No guarding.  Genitourinary: Deferred Musculoskeletal: Normal range of motion in all  extremities. No lower extremity edema. Neurologic:  Normal speech and language. No gross focal neurologic deficits are appreciated.  Skin:  Skin is warm, dry and intact. No rash noted. Psychiatric: Mood and affect are normal. Speech and behavior are normal. Patient exhibits appropriate insight and judgment.  ____________________________________________    LABS (pertinent positives/negatives)  Trop hs <2 CBC wbc 9.0, hgb 12.6, plt 380 BMP wnl  ____________________________________________   EKG  I, Phineas Semen, attending physician, personally viewed and interpreted this EKG  EKG Time: 1741 Rate: 92 Rhythm: normal sinus rhythm Axis: rightward axis Intervals: qtc 447 QRS: narrow ST changes: no st elevation Impression: abnormal ekg  ____________________________________________    RADIOLOGY  CXR No acute disease  ____________________________________________   PROCEDURES  Procedures  ____________________________________________   INITIAL IMPRESSION / ASSESSMENT AND PLAN / ED COURSE  Pertinent labs & imaging results that were available during my care of the patient were reviewed by me and considered in my medical decision making (see chart for details).   Patient presented to the emergency department today because of concerns for chest pain that started early this morning.  Differential would be broad including pneumonia, pneumothorax, PE, dissection, ACS, gastritis, costochondritis amongst other etiologies.  Patient's work-up here without any concerning findings.  She was given viscous lidocaine and did have good improvement of her symptoms.  I do wonder if she might of triggered some acid reflux given that she drank grapefruit juice last night.  Do think this is likely gastritis.  Discussed this with the patient.  Will plan on discharging home with antiacid and sucralfate.  ____________________________________________   FINAL CLINICAL IMPRESSION(S) / ED  DIAGNOSES  Final diagnoses:  Gastritis, presence of bleeding unspecified, unspecified chronicity, unspecified gastritis type     Note: This dictation was prepared with Dragon dictation. Any transcriptional errors that result from this process are unintentional     Phineas Semen, MD 11/17/18 2125

## 2018-11-17 NOTE — ED Triage Notes (Signed)
Pt to ED from home c/o left chest pain radiating to left arm and back that is dull and burning that started this morning after she woke up.  Denies SOB, denies n/v/d.  Denies blood thinners.  Chest rise even and unlabored, skin WNL, in NAD at this time.

## 2018-11-17 NOTE — ED Notes (Signed)
MD at bedside. Pt reports relief of CP and ABD pain after taking medication.

## 2018-11-17 NOTE — Discharge Instructions (Signed)
Please seek medical attention for any high fevers, chest pain, shortness of breath, change in behavior, persistent vomiting, bloody stool or any other new or concerning symptoms.  

## 2019-02-25 ENCOUNTER — Ambulatory Visit: Payer: 59 | Attending: Internal Medicine

## 2019-02-25 DIAGNOSIS — Z20822 Contact with and (suspected) exposure to covid-19: Secondary | ICD-10-CM

## 2019-02-26 LAB — NOVEL CORONAVIRUS, NAA: SARS-CoV-2, NAA: NOT DETECTED

## 2019-03-04 ENCOUNTER — Emergency Department
Admission: EM | Admit: 2019-03-04 | Discharge: 2019-03-04 | Disposition: A | Discharge status: Home / Self Care | Payer: 59 | Attending: Emergency Medicine | Admitting: Emergency Medicine

## 2019-03-04 ENCOUNTER — Other Ambulatory Visit: Payer: Self-pay

## 2019-03-04 ENCOUNTER — Emergency Department: Payer: 59

## 2019-03-04 DIAGNOSIS — M546 Pain in thoracic spine: Secondary | ICD-10-CM | POA: Diagnosis not present

## 2019-03-04 DIAGNOSIS — M79602 Pain in left arm: Secondary | ICD-10-CM | POA: Diagnosis not present

## 2019-03-04 DIAGNOSIS — F17210 Nicotine dependence, cigarettes, uncomplicated: Secondary | ICD-10-CM | POA: Insufficient documentation

## 2019-03-04 DIAGNOSIS — M79601 Pain in right arm: Secondary | ICD-10-CM | POA: Diagnosis not present

## 2019-03-04 DIAGNOSIS — Z79899 Other long term (current) drug therapy: Secondary | ICD-10-CM | POA: Diagnosis not present

## 2019-03-04 DIAGNOSIS — J45909 Unspecified asthma, uncomplicated: Secondary | ICD-10-CM | POA: Insufficient documentation

## 2019-03-04 LAB — BASIC METABOLIC PANEL
Anion gap: 10 (ref 5–15)
BUN: 13 mg/dL (ref 6–20)
CO2: 24 mmol/L (ref 22–32)
Calcium: 9.3 mg/dL (ref 8.9–10.3)
Chloride: 103 mmol/L (ref 98–111)
Creatinine, Ser: 0.51 mg/dL (ref 0.44–1.00)
GFR calc Af Amer: >60 mL/min (ref >60)
GFR calc non Af Amer: >60 mL/min (ref >60)
Glucose, Bld: 124 mg/dL — ABNORMAL HIGH (ref 70–99)
Potassium: 4.1 mmol/L (ref 3.5–5.1)
Sodium: 137 mmol/L (ref 135–145)

## 2019-03-04 LAB — CBC WITH DIFFERENTIAL/PLATELET
Abs Immature Granulocytes: 0.01 10*3/uL (ref 0.00–0.07)
Basophils Absolute: 0 10*3/uL (ref 0.0–0.1)
Basophils Relative: 1 %
Eosinophils Absolute: 0.2 10*3/uL (ref 0.0–0.5)
Eosinophils Relative: 2 %
HCT: 40.7 % (ref 36.0–46.0)
Hemoglobin: 13.3 g/dL (ref 12.0–15.0)
Immature Granulocytes: 0 %
Lymphocytes Relative: 37 %
Lymphs Abs: 2.3 10*3/uL (ref 0.7–4.0)
MCH: 28.8 pg (ref 26.0–34.0)
MCHC: 32.7 g/dL (ref 30.0–36.0)
MCV: 88.1 fL (ref 80.0–100.0)
Monocytes Absolute: 0.6 10*3/uL (ref 0.1–1.0)
Monocytes Relative: 9 %
Neutro Abs: 3.2 10*3/uL (ref 1.7–7.7)
Neutrophils Relative %: 51 %
Platelets: 363 10*3/uL (ref 150–400)
RBC: 4.62 MIL/uL (ref 3.87–5.11)
RDW: 14.6 % (ref 11.5–15.5)
WBC: 6.2 10*3/uL (ref 4.0–10.5)
nRBC: 0 % (ref 0.0–0.2)

## 2019-03-04 LAB — TROPONIN I (HIGH SENSITIVITY)
Troponin I (High Sensitivity): 2 ng/L (ref <18)
Troponin I (High Sensitivity): 3 ng/L (ref <18)

## 2019-03-04 NOTE — ED Notes (Signed)
Called lab and confirmed they had blood work for labs orders

## 2019-03-04 NOTE — ED Notes (Signed)
Ariel RN informed of pt in room

## 2019-03-04 NOTE — ED Notes (Signed)
Labs sent by EDT Lorin Picket

## 2019-03-04 NOTE — ED Triage Notes (Addendum)
Pt comes via POV with c/o bilateral arm pressure and neck pressure. Pt states this started this am.  Pt denies chest pain or SOB. Pt states the pain was tightness that went down both arms.  Pt states no pain at this time. But her arms feel weak.

## 2019-03-04 NOTE — ED Provider Notes (Signed)
[facility] Emergency Department Provider Note   ____________________________________________   First MD Initiated Contact with Patient 03/04/19 1211     (approximate)  I have reviewed the triage vital signs and the nursing notes.   HISTORY  Chief Complaint Arm Pain and Neck Pain    HPI Karen Reed is a 59 y.o. female with past medical history of asthma and Sjogren's disease who presents to the ED complaining of back and arm pain.  Patient reports that 1 to 2 hours prior to arrival she had sudden onset of discomfort shooting from both sides of her back into her arms.  She states this made them feel numb and tingly, seemed to ease off after a few minutes but then came back for a second time.  Pain has now again resolved, but she describes lingering feeling of weakness in both of her arms and legs.  She did not have any associated chest pain, shortness of breath, nausea, or vomiting.  She has otherwise been feeling well recently with no fevers or cough.  She denies any history of neck problems neck problems, did not have any specific pain in her neck with this episode.  She denies any prior episodes of similar symptoms.        Past Medical History:  Diagnosis Date  . Asthma    Bronchitis induced  . H/O seasonal allergies   . Heart palpitations   . Sjogren's disease [facility])     Patient Active Problem List   Diagnosis Date Noted  . Palpitations 02/12/2018    Past Surgical History:  Procedure Laterality Date  . NO PAST SURGERIES      Prior to Admission medications   Medication Sig Start Date End Date Taking? Authorizing Provider  amoxicillin-clavulanate (AUGMENTIN) 875-125 MG tablet Take 1 tablet by mouth every 12 (twelve) hours. 02/22/18   Renford Dills, NP  famotidine (PEPCID) 20 MG tablet Take 1 tablet (20 mg total) by mouth daily. 11/17/18 11/17/19  Phineas Semen, MD  fluticasone (FLONASE) 50 MCG/ACT nasal spray 1 spray by Each Nare route  daily. 02/13/18 02/13/19  [provider]  fluticasone (FLONASE) 50 MCG/ACT nasal spray Place 1 spray into both nostrils daily. 02/22/18   Renford Dills, NP  loratadine (CLARITIN) 10 MG tablet TAKE 1 TABLET BY MOUTH EVERY DAY 02/17/18   [provider]  sucralfate (CARAFATE) 1 g tablet Take 1 tablet (1 g total) by mouth 4 (four) times daily. 11/17/18   Phineas Semen, MD    Allergies Atorvastatin, Biaxin [clarithromycin], Codeine, Doxycycline, Levaquin [levofloxacin], and Sulfa antibiotics  Family History  Problem Relation Age of Onset  . Heart failure Father   . Hypertension Father     Social History Social History   Tobacco Use  . Smoking status: Current Every Day Smoker    Packs/day: 1.00    Types: Cigarettes  . Smokeless tobacco: Never Used  Substance Use Topics  . Alcohol use: No  . Drug use: No    Review of Systems  Constitutional: No fever/chills Eyes: No visual changes. ENT: No sore throat. Cardiovascular: Denies chest pain. Respiratory: Denies shortness of breath. Gastrointestinal: No abdominal pain.  No nausea, no vomiting.  No diarrhea.  No constipation. Genitourinary: Negative for dysuria. Musculoskeletal: Positive for back pain. Skin: Negative for rash. Neurological: Negative for headaches or focal weakness.  Positive for arm and leg weakness.  ____________________________________________   PHYSICAL EXAM:  VITAL SIGNS: ED Triage Vitals  Enc Vitals Group  BP 03/04/19 1157 125/86     Pulse Rate 03/04/19 1157 89     Resp 03/04/19 1157 16     Temp 03/04/19 1157 98.4 F (36.9 C)     Temp src --      SpO2 03/04/19 1157 96 %     Weight 03/04/19 1206 222 lb (100.7 kg)     Height 03/04/19 1206 5\' 7"  (1.702 m)     Head Circumference --      Peak Flow --      Pain Score 03/04/19 1206 0     Pain Loc --      Pain Edu? --      Excl. in [place]? --     Constitutional: Alert and oriented. Eyes: Conjunctivae are normal. Head: Atraumatic.  Nose: No congestion/rhinnorhea. Mouth/Throat: Mucous membranes are moist. Neck: Normal ROM, no midline cervical spine tenderness. Cardiovascular: Normal rate, regular rhythm. Grossly normal heart sounds.  2+ radial pulses bilaterally. Respiratory: Normal respiratory effort.  No retractions. Lungs CTAB. Gastrointestinal: Soft and nontender. No distention. Genitourinary: deferred Musculoskeletal: No lower extremity tenderness nor edema. Neurologic:  Normal speech and language. No gross focal neurologic deficits are appreciated.  5-5 strength in bilateral upper and lower extremities, no pronator drift. Skin:  Skin is warm, dry and intact. No rash noted. Psychiatric: Mood and affect are normal. Speech and behavior are normal.  ____________________________________________   LABS (all labs ordered are listed, but only abnormal results are displayed)  Labs Reviewed  BASIC METABOLIC PANEL - Abnormal; Notable for the following components:      Result Value   Glucose, Bld 124 (*)    All other components within normal limits  CBC WITH DIFFERENTIAL/PLATELET  TROPONIN I (HIGH SENSITIVITY)  TROPONIN I (HIGH SENSITIVITY)   ____________________________________________  EKG  ED ECG REPORT I, Blake Divine, the attending physician, personally viewed and interpreted this ECG.   Date: 03/04/2019  EKG Time: 11:51  Rate: 96  Rhythm: normal sinus rhythm  Axis: Normal  Intervals:none  ST&T Change: None   PROCEDURES  Procedure(s) performed (including Critical Care):  Procedures   ____________________________________________   INITIAL IMPRESSION / ASSESSMENT AND PLAN / ED COURSE       59 year old female with history of asthma and Sjogren's presents to the ED with acute onset episode of discomfort moving from her back into both arms with associated numbness and weakness.  Pain has resolved but she complains of ongoing weakness that seems to affect all of her extremities.  She is  neurovascularly intact to her upper and lower extremities with no focal neurologic deficits, doubt stroke.  While she may have some cervical pathology, doubt significant spinal stenosis given her lack of neurologic deficit.  EKG without acute ischemic changes and I have a low suspicion for ACS but we will screen 2 sets of troponin, basic labs, and chest x-ray.  Chest x-ray and labs are reassuring, 2 sets of troponin are within normal limits.  Patient symptoms have entirely resolved and she is appropriate for discharge home with PCP follow-up.  I have counseled her to return to the ED for new worsening symptoms, patient agrees to plan.      ____________________________________________   FINAL CLINICAL IMPRESSION(S) / ED DIAGNOSES  Final diagnoses:  Pain in both upper extremities  Acute bilateral thoracic back pain     ED Discharge Orders    None       Note:  This document was prepared using Dragon voice recognition software and may include  unintentional dictation errors.   Chesley Noon, MD 03/04/19 [phone]

## 2019-03-29 ENCOUNTER — Ambulatory Visit: Payer: 59 | Attending: Internal Medicine

## 2019-03-29 DIAGNOSIS — Z20822 Contact with and (suspected) exposure to covid-19: Secondary | ICD-10-CM

## 2019-03-30 LAB — NOVEL CORONAVIRUS, NAA: SARS-CoV-2, NAA: NOT DETECTED

## 2019-05-19 ENCOUNTER — Ambulatory Visit: Payer: 59 | Attending: Internal Medicine

## 2019-06-19 ENCOUNTER — Ambulatory Visit
Admission: EM | Admit: 2019-06-19 | Discharge: 2019-06-19 | Disposition: A | Discharge status: Home / Self Care | Payer: 59 | Attending: Family Medicine | Admitting: Family Medicine

## 2019-06-19 ENCOUNTER — Other Ambulatory Visit: Payer: Self-pay

## 2019-06-19 DIAGNOSIS — K0889 Other specified disorders of teeth and supporting structures: Secondary | ICD-10-CM

## 2019-06-19 MED ORDER — LIDOCAINE VISCOUS HCL 2 % MT SOLN: 15.0000 mL | Freq: Three times a day (TID) | OROMUCOSAL | 0 refills | Status: DC | PRN

## 2019-06-19 MED ORDER — AMOXICILLIN-POT CLAVULANATE 875-125 MG PO TABS: 1.0000 | ORAL_TABLET | Freq: Two times a day (BID) | ORAL | 0 refills | Status: DC

## 2019-06-19 NOTE — ED Triage Notes (Signed)
Patient complains of dental pain after having a tooth pulled Thursday. Patient states that she is having burning at the site and states that she is having sinus pain and headache. Reports that she called oral surgeon and they started her on Antibiotics.

## 2019-06-19 NOTE — ED Provider Notes (Signed)
[facility] ____________________________________________  Time seen: Approximately 3:52 PM  I have reviewed the triage vital signs and the nursing notes.   HISTORY  Chief Complaint Dental Pain   HPI Karen Reed is a 59 y.o. female presenting for evaluation of left upper dental pain post tooth extraction.  Reports this past Thursday she went to the dentist to have a tooth that was extracted as she described that the tooth was getting infected.  Reports the last 2 days she is noticing tenderness of the gumline, a burning pain tenderness around the same area on her face as well as it is getting swollen.  Has been rinsing her mouth frequently.  States that she called her dentist and they called and 500 mg amoxicillin tablet patient expressed concern of the amoxicillin tablets and request twice a day Amoxicillin.  Denies fevers.  Denies cough, congestion or recent sickness.  Denies other recent antibiotic use.  Reports otherwise doing well.   Past Medical History:  Diagnosis Date  . Asthma    Bronchitis induced  . H/O seasonal allergies   . Heart palpitations   . Sjogren's disease [facility])     Patient Active Problem List   Diagnosis Date Noted  . Palpitations 02/12/2018    Past Surgical History:  Procedure Laterality Date  . NO PAST SURGERIES       No current facility-administered medications for this encounter.  Current Outpatient Medications:  .  atorvastatin (LIPITOR) 20 MG tablet, Take 20 mg by mouth daily., Disp: , Rfl:  .  chlorhexidine (PERIDEX) 0.12 % solution, , Disp: , Rfl:  .  hydroxychloroquine (PLAQUENIL) 200 MG tablet, Take 200 mg by mouth 2 (two) times daily., Disp: , Rfl:  .  loratadine (CLARITIN) 10 MG tablet, TAKE 1 TABLET BY MOUTH EVERY DAY, Disp: , Rfl:  .  losartan (COZAAR) 25 MG tablet, Take 25 mg by mouth daily., Disp: , Rfl:  .  amoxicillin-clavulanate (AUGMENTIN) 875-125 MG tablet, Take 1 tablet by mouth every 12 (twelve) hours., Disp: 20  tablet, Rfl: 0 .  fluticasone (FLONASE) 50 MCG/ACT nasal spray, 1 spray by Each Nare route daily., Disp: , Rfl:  .  lidocaine (XYLOCAINE) 2 % solution, Use as directed 15 mLs in the mouth or throat every 8 (eight) hours as needed for mouth pain (hold to area then spit)., Disp: 200 mL, Rfl: 0  Allergies Atorvastatin, Biaxin [clarithromycin], Codeine, Doxycycline, Levaquin [levofloxacin], and Sulfa antibiotics  Family History  Problem Relation Age of Onset  . Heart failure Father   . Hypertension Father     Social History Social History   Tobacco Use  . Smoking status: Current Every Day Smoker    Packs/day: 1.00    Types: Cigarettes  . Smokeless tobacco: Never Used  Substance Use Topics  . Alcohol use: No  . Drug use: No    Review of Systems Constitutional: No fever ENT: No sore throat. Cardiovascular: Denies chest pain. Respiratory: Denies shortness of breath. Skin: Negative for rash.   ____________________________________________   PHYSICAL EXAM:  VITAL SIGNS: ED Triage Vitals  Enc Vitals Group     BP 06/19/19 1534 (!) 145/90     Pulse Rate 06/19/19 1534 68     Resp 06/19/19 1534 18     Temp 06/19/19 1534 98.1 F (36.7 C)     Temp Source 06/19/19 1534 Oral     SpO2 06/19/19 1534 100 %     Weight 06/19/19 1532 230 lb (104.3 kg)  Height 06/19/19 1532 5\' 7"  (1.702 m)     Head Circumference --      Peak Flow --      Pain Score 06/19/19 1532 9     Pain Loc --      Pain Edu? --      Excl. in [place]? --     Constitutional: Alert and oriented. Well appearing and in no acute distress. Eyes: Conjunctivae are normal. ENT      Head: Normocephalic and atraumatic.      Nose: No congestion      Mouth/Throat: Mucous membranes are moist.Oropharynx non-erythematous.  Left upper gumline, molar extracted with gumline erythema and edema, no palpable abscess, no drainage, tenderness, no other tooth tenderness. Hematological/Lymphatic/Immunilogical: No cervical  lymphadenopathy. Respiratory: Normal respiratory effort without tachypnea nor retractions. Musculoskeletal:Steady gait.  Neurologic:  Normal speech and language. Speech is normal. No gait instability.  Skin:  Skin is warm, dry and intact. No rash noted. Psychiatric: Mood and affect are normal. Speech and behavior are normal. Patient exhibits appropriate insight and judgment   ___________________________________________   LABS (all labs ordered are listed, but only abnormal results are displayed)  Labs Reviewed - No data to display   PROCEDURES Procedures   INITIAL IMPRESSION / ASSESSMENT AND PLAN / ED COURSE  Pertinent labs & imaging results that were available during my care of the patient were reviewed by me and considered in my medical decision making (see chart for details).  Well-appearing patient.  No acute distress.  Concern for dental infection post extraction.  Treating with Augmentin and viscous lidocaine.  Over-the-counter Tylenol ibuprofen as needed.  Mouth rinses.  Monitor.  Follow-up with dentist this week.Discussed indication, risks and benefits of medications with patient.   Discussed follow up and return parameters including no resolution or any worsening concerns. Patient verbalized understanding and agreed to plan.   ____________________________________________   FINAL CLINICAL IMPRESSION(S) / ED DIAGNOSES  Final diagnoses:  Pain, dental     ED Discharge Orders         Ordered    amoxicillin-clavulanate (AUGMENTIN) 875-125 MG tablet  Every 12 hours     06/19/19 1550    lidocaine (XYLOCAINE) 2 % solution  Every 8 hours PRN     06/19/19 1550           Note: This dictation was prepared with Dragon dictation along with smaller phrase technology. Any transcriptional errors that result from this process are unintentional.         Marylene Land, NP 06/19/19 [phone]

## 2019-06-19 NOTE — Discharge Instructions (Addendum)
Take medication as prescribed. Rest. Drink plenty of fluids. Rinse frequently.   Follow up with your primary care physician this week as needed. Return to Urgent care for new or worsening concerns.

## 2019-06-21 ENCOUNTER — Encounter: Payer: Self-pay | Admitting: Emergency Medicine

## 2019-06-21 ENCOUNTER — Emergency Department: Payer: 59

## 2019-06-21 ENCOUNTER — Other Ambulatory Visit: Payer: Self-pay

## 2019-06-21 ENCOUNTER — Emergency Department
Admission: EM | Admit: 2019-06-21 | Discharge: 2019-06-21 | Disposition: A | Discharge status: Home / Self Care | Payer: 59 | Attending: Emergency Medicine | Admitting: Emergency Medicine

## 2019-06-21 DIAGNOSIS — I10 Essential (primary) hypertension: Secondary | ICD-10-CM | POA: Diagnosis not present

## 2019-06-21 DIAGNOSIS — F17210 Nicotine dependence, cigarettes, uncomplicated: Secondary | ICD-10-CM | POA: Diagnosis not present

## 2019-06-21 DIAGNOSIS — R519 Headache, unspecified: Secondary | ICD-10-CM | POA: Insufficient documentation

## 2019-06-21 DIAGNOSIS — Z79899 Other long term (current) drug therapy: Secondary | ICD-10-CM | POA: Diagnosis not present

## 2019-06-21 DIAGNOSIS — R0789 Other chest pain: Secondary | ICD-10-CM | POA: Diagnosis present

## 2019-06-21 DIAGNOSIS — R42 Dizziness and giddiness: Secondary | ICD-10-CM | POA: Diagnosis not present

## 2019-06-21 LAB — COMPREHENSIVE METABOLIC PANEL
ALT: 23 U/L (ref 0–44)
AST: 23 U/L (ref 15–41)
Albumin: 3.7 g/dL (ref 3.5–5.0)
Alkaline Phosphatase: 70 U/L (ref 38–126)
Anion gap: 9 (ref 5–15)
BUN: 9 mg/dL (ref 6–20)
CO2: 24 mmol/L (ref 22–32)
Calcium: 9 mg/dL (ref 8.9–10.3)
Chloride: 106 mmol/L (ref 98–111)
Creatinine, Ser: 0.63 mg/dL (ref 0.44–1.00)
GFR calc Af Amer: >60 mL/min (ref >60)
GFR calc non Af Amer: >60 mL/min (ref >60)
Glucose, Bld: 92 mg/dL (ref 70–99)
Potassium: 3.9 mmol/L (ref 3.5–5.1)
Sodium: 139 mmol/L (ref 135–145)
Total Bilirubin: 1 mg/dL (ref 0.3–1.2)
Total Protein: 7.4 g/dL (ref 6.5–8.1)

## 2019-06-21 LAB — CBC
HCT: 35.3 % — ABNORMAL LOW (ref 36.0–46.0)
Hemoglobin: 11.9 g/dL — ABNORMAL LOW (ref 12.0–15.0)
MCH: 29.4 pg (ref 26.0–34.0)
MCHC: 33.7 g/dL (ref 30.0–36.0)
MCV: 87.2 fL (ref 80.0–100.0)
Platelets: 311 10*3/uL (ref 150–400)
RBC: 4.05 MIL/uL (ref 3.87–5.11)
RDW: 14.6 % (ref 11.5–15.5)
WBC: 5.3 10*3/uL (ref 4.0–10.5)
nRBC: 0 % (ref 0.0–0.2)

## 2019-06-21 LAB — LIPASE, BLOOD: Lipase: 24 U/L (ref 11–51)

## 2019-06-21 LAB — TROPONIN I (HIGH SENSITIVITY)
Troponin I (High Sensitivity): 3 ng/L (ref <18)
Troponin I (High Sensitivity): 3 ng/L (ref <18)

## 2019-06-21 IMAGING — CT CT HEAD W/O CM
3 series · 16 of 47 positions shown, 19 images · non-contrast
Comparison: [DATE]

CLINICAL DATA: Hypertension.

EXAM:
CT HEAD WITHOUT CONTRAST
TECHNIQUE: Contiguous axial images were obtained from the base of the skull
through the vertex without intravenous contrast.

[Series 3: head wo · axial · 0.47mm/px · z∈[-122,+8]mm · 10 of 32 slices shown, 13 images]
[im 3/32  brain]
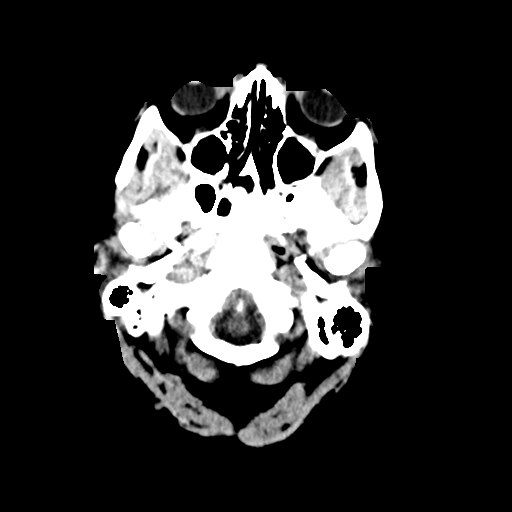
[im 3/32  bone]
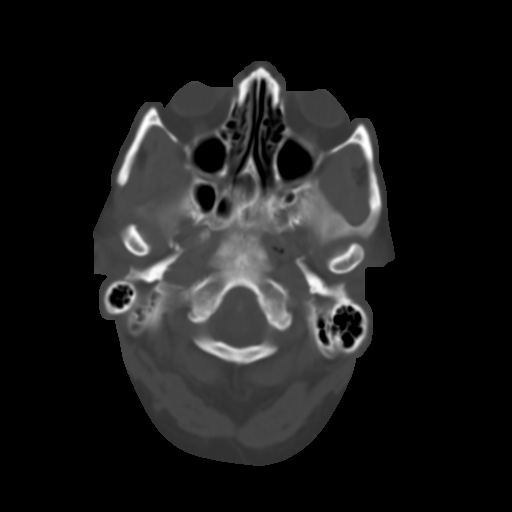
[im 6/32  brain]
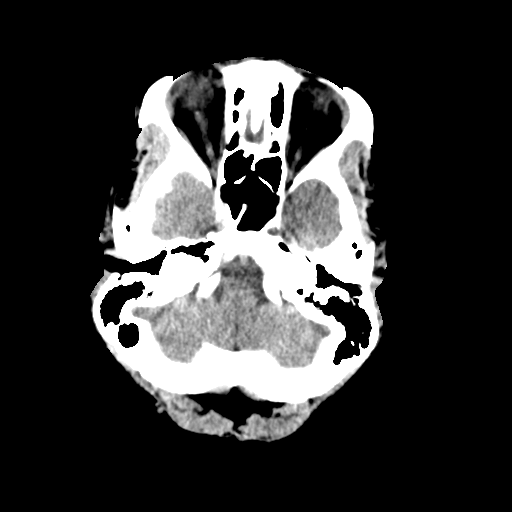
[im 9/32  brain]
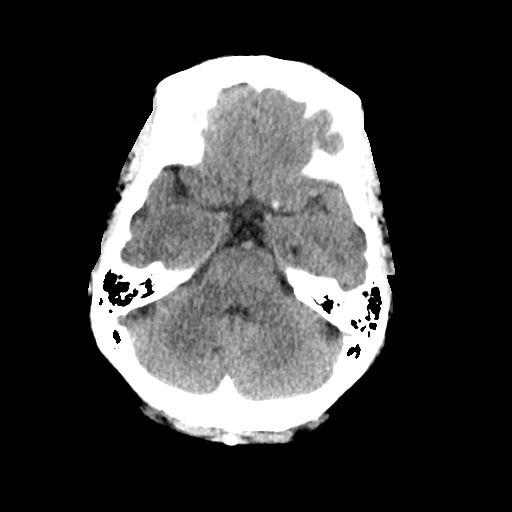
[im 11/32  brain]
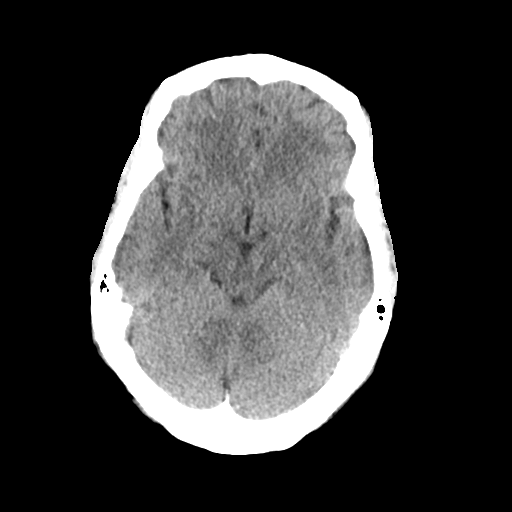
[im 14/32  brain]
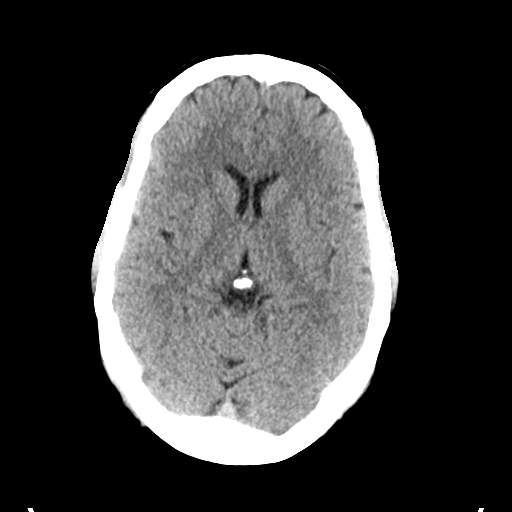
[im 14/32  bone]
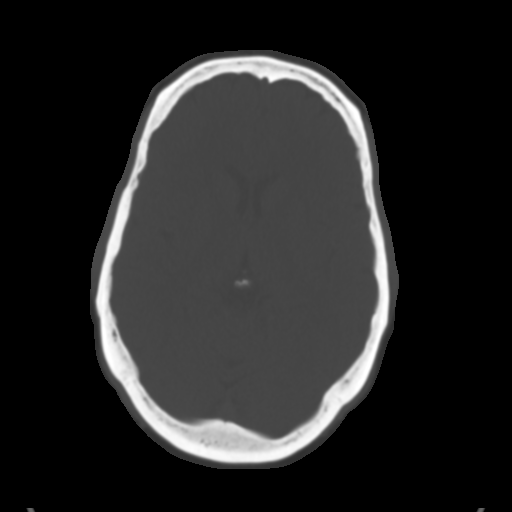
[im 18/32  brain]
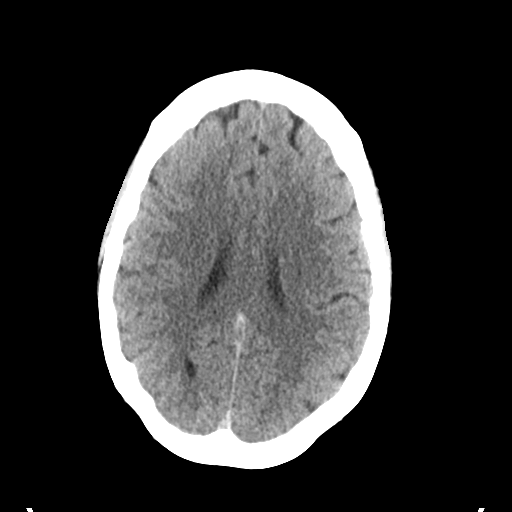
[im 21/32  brain]
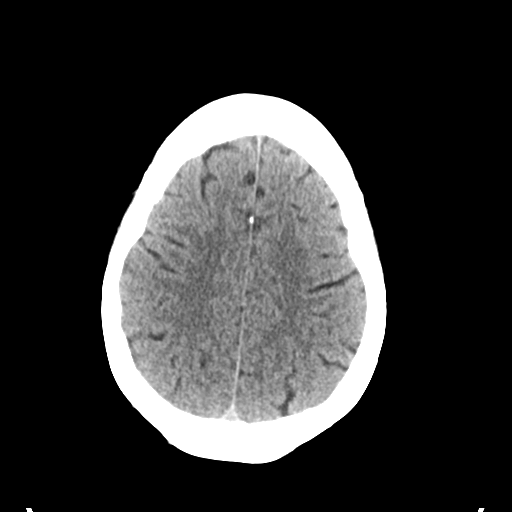
[im 24/32  brain]
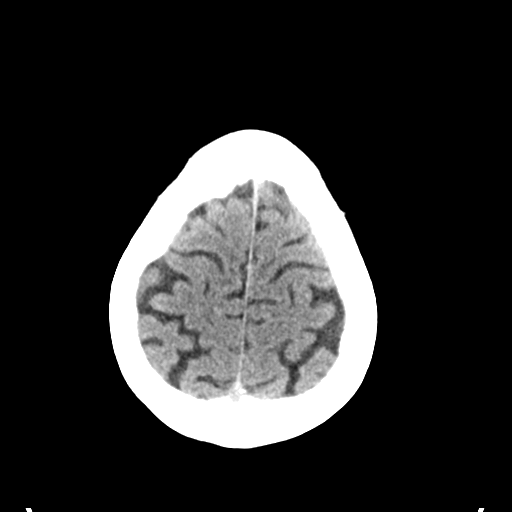
[im 26/32  brain]
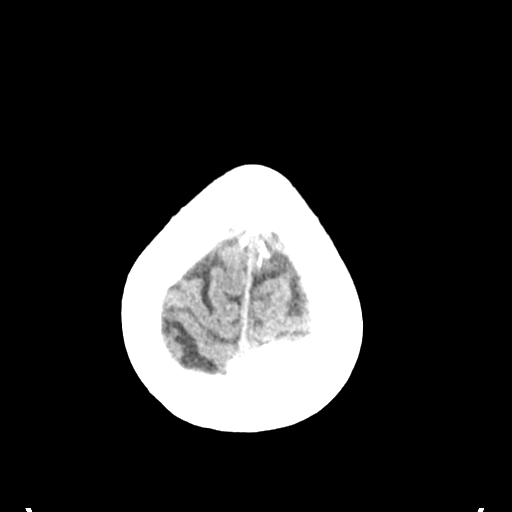
[im 26/32  bone]
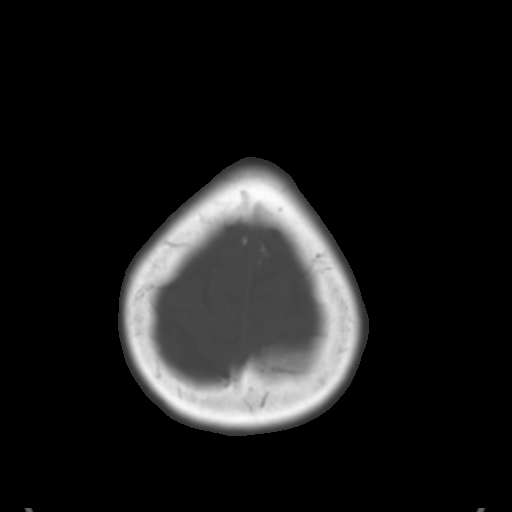
[im 29/32  brain]
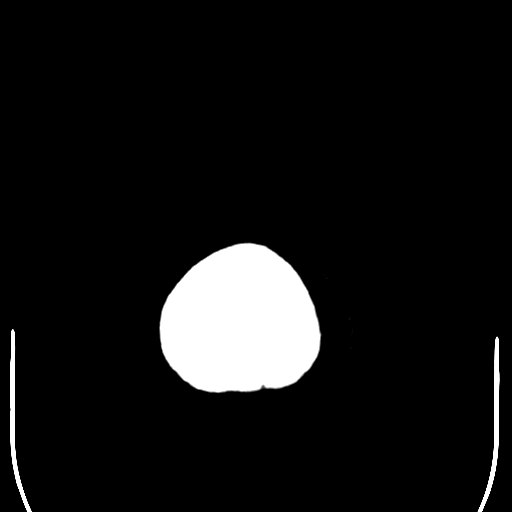

[Series 4: coronal soft tissue · coronal · 0.34mm/px · 3 of 70 slices shown]
[im 24/70  brain]
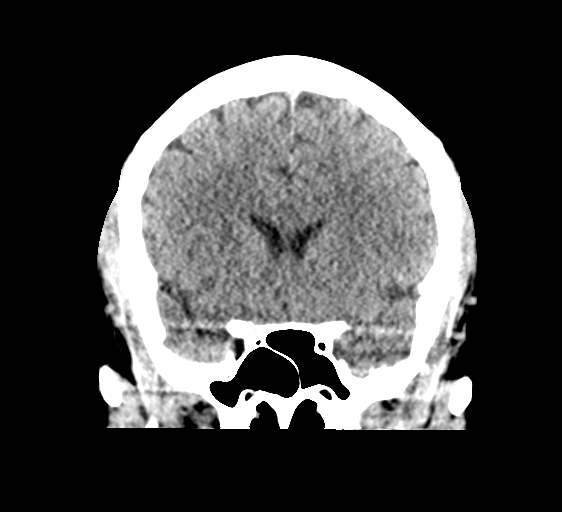
[im 31/70  brain]
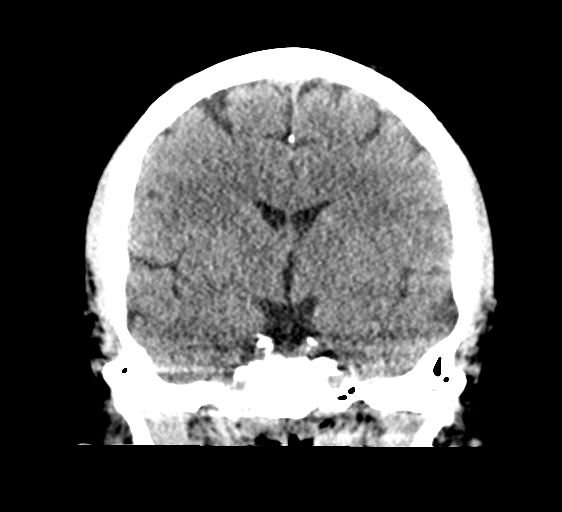
[im 39/70  brain]
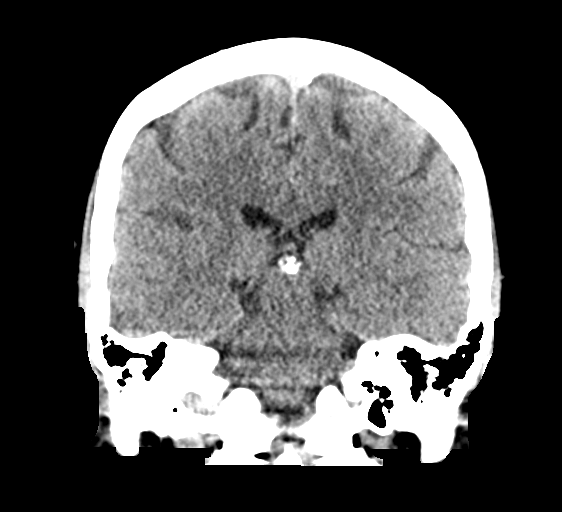

[Series 5: sagittal soft tissue · sagittal · 0.36mm/px · 3 of 54 slices shown]
[im 18/54  brain]
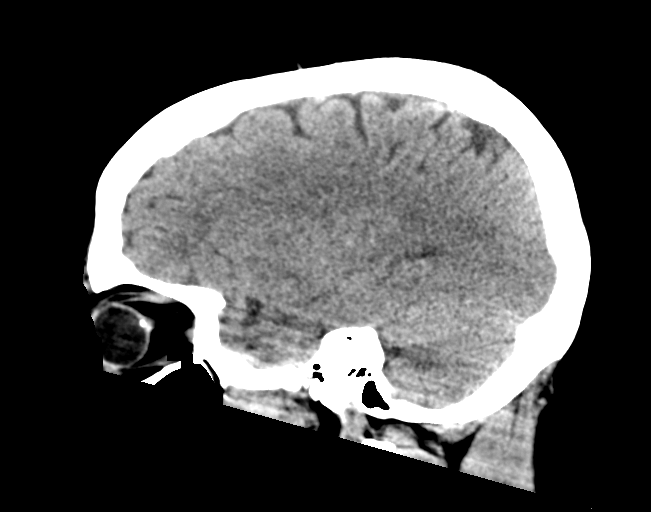
[im 27/54  brain]
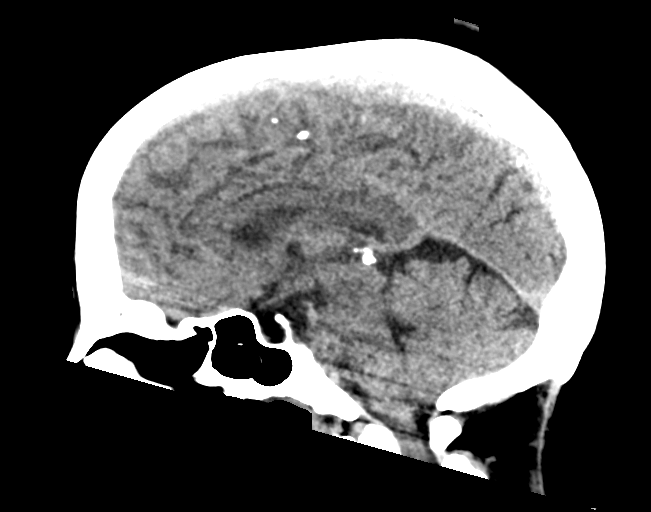
[im 36/54  brain]
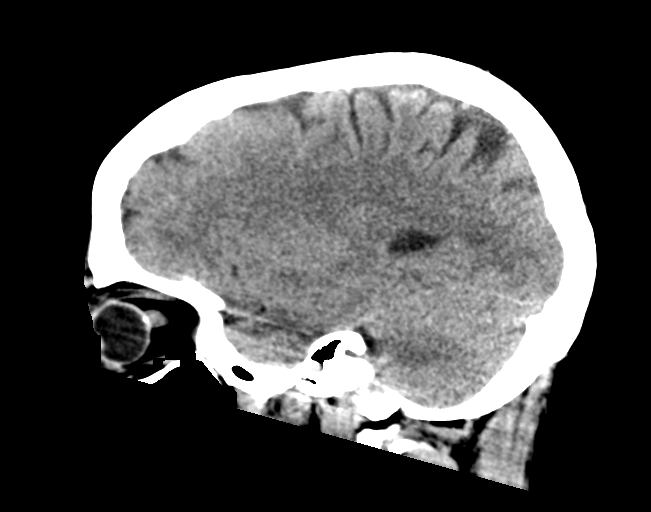

[16 of 47 positions shown; findings below may reference images not displayed]

FINDINGS: Brain: No evidence of acute infarction, hemorrhage, hydrocephalus,
extra-axial collection or mass lesion/mass effect.

Vascular: No hyperdense vessel or unexpected calcification.

Skull: Normal. Negative for fracture or focal lesion.

Sinuses/Orbits: No acute finding.

Other: None.
IMPRESSION: No acute intracranial pathology.

## 2019-06-21 MED ORDER — HYDROCODONE-ACETAMINOPHEN 5-325 MG PO TABS: 1.0000 | ORAL_TABLET | Freq: Four times a day (QID) | ORAL | 0 refills | Status: DC | PRN

## 2019-06-21 MED ORDER — SODIUM CHLORIDE 0.9% FLUSH: 3.0000 mL | Freq: Once | INTRAVENOUS | Status: DC

## 2019-06-21 MED ORDER — ONDANSETRON 4 MG PO TBDP
4.0000 mg | ORAL_TABLET | Freq: Three times a day (TID) | ORAL | 0 refills | Status: DC | PRN
Start: 2019-06-21 — End: 2019-07-25

## 2019-06-21 NOTE — ED Notes (Signed)
Pt reports she was at dentist this morning and when they injected the numbing medications "something with epinephrine" she began to have chest pain and felt her skin crawling and states "I wanted to scratch my face off". Patient reports resolution of most symptoms at this time.

## 2019-06-21 NOTE — ED Triage Notes (Signed)
First Nurse Note:  Seen at dentist this morning and had a tooth pack.   EMS called for brief episode of chest pain.  Currently denies CP.  VS wnl.  NAD.  EKG wnl.  20 g saline lock to right wrist.

## 2019-06-21 NOTE — Discharge Instructions (Signed)
As we discussed, I suspect your high blood pressure could be related to pain from your tooth.  Continue your packing.  I have also prescribed a stronger pain medication to take for your dental pain.  If you notice that you are having pain your blood pressure is elevated, take the pain medication and recheck your blood pressure in approximately 1 hour.

## 2019-06-21 NOTE — ED Provider Notes (Signed)
[facility] EMERGENCY DEPARTMENT Provider Note   CSN: [id number] Arrival date & time: 06/21/19  1324     History Chief Complaint  Patient presents with  . Chest Pain    Karen Reed is a 59 y.o. female.  The history is provided by the patient.  Chest Pain Associated symptoms: fatigue, headache and weakness   Associated symptoms: no abdominal pain, no back pain, no cough, no fever, no nausea, no shortness of breath and no vomiting    59 year old female with past medical history as below including hypertension, Sjogren's disease, here with very transient chest pain, headache, lightheadedness.  The patient states that she was at the dentist earlier today to have a dry socket packed.  She just had a tooth removed and states that since then, she been having fairly severe left upper tooth pain which was attributed to dry socket as well as possible reaction to local anesthesia.  She states that over the last several days, she has felt weak, intermittently with blurred vision, as well as had some very transient substernal, aching chest pain.  She went to urgent care noticed that her blood pressure was markedly elevated today so she was sent here for further evaluation.  She is had no further chest pain today.  No current headache or dizziness.  No recent medication changes other than Augmentin and Tylenol and ibuprofen which she has been taking for her tooth.  Of note, she does note she has been eating significantly saltier, softer foods due to her tooth pain, which is also new for her.  Her pain is currently 6 out of 10, and this is a significant improvement after the packing.  No other over-the-counter meds.  Denies any drug use.  No specific alleviating or aggravating factors.  No focal numbness or weakness.    Past Medical History:  Diagnosis Date  . Asthma    Bronchitis induced  . H/O seasonal allergies   . Heart palpitations   . Sjogren's disease [facility])      Patient Active Problem List   Diagnosis Date Noted  . Palpitations 02/12/2018    Past Surgical History:  Procedure Laterality Date  . NO PAST SURGERIES       OB History   No obstetric history on file.     Family History  Problem Relation Age of Onset  . Heart failure Father   . Hypertension Father     Social History   Tobacco Use  . Smoking status: Current Every Day Smoker    Packs/day: 1.00    Types: Cigarettes  . Smokeless tobacco: Never Used  Substance Use Topics  . Alcohol use: No  . Drug use: No    Home Medications Prior to Admission medications   Medication Sig Start Date End Date Taking? Authorizing Provider  amoxicillin-clavulanate (AUGMENTIN) 875-125 MG tablet Take 1 tablet by mouth every 12 (twelve) hours. 06/19/19   Renford Dills, NP  atorvastatin (LIPITOR) 20 MG tablet Take 20 mg by mouth daily. 06/19/19   [provider]  chlorhexidine (PERIDEX) 0.12 % solution  06/19/19   [provider]  fluticasone (FLONASE) 50 MCG/ACT nasal spray 1 spray by Each Nare route daily. 02/13/18 02/13/19  [provider]  HYDROcodone-acetaminophen (NORCO/VICODIN) 5-325 MG tablet Take 1-2 tablets by mouth every 6 (six) hours as needed for severe pain. 06/21/19 06/20/20  Shaune Pollack, MD  hydroxychloroquine (PLAQUENIL) 200 MG tablet Take 200 mg by mouth 2 (two) times daily. 05/26/19  [provider]  lidocaine (XYLOCAINE) 2 % solution Use as directed 15 mLs in the mouth or throat every 8 (eight) hours as needed for mouth pain (hold to area then spit). 06/19/19   Marylene Land, NP  loratadine (CLARITIN) 10 MG tablet TAKE 1 TABLET BY MOUTH EVERY DAY 02/17/18   [provider]  losartan (COZAAR) 25 MG tablet Take 25 mg by mouth daily. 05/14/19   [provider]  ondansetron (ZOFRAN ODT) 4 MG disintegrating tablet Take 1 tablet (4 mg total) by mouth every 8 (eight) hours as needed for nausea or vomiting. 06/21/19   Duffy Bruce, MD   famotidine (PEPCID) 20 MG tablet Take 1 tablet (20 mg total) by mouth daily. 11/17/18 06/19/19  Nance Pear, MD  sucralfate (CARAFATE) 1 g tablet Take 1 tablet (1 g total) by mouth 4 (four) times daily. 11/17/18 06/19/19  Nance Pear, MD    Allergies    Atorvastatin, Biaxin [clarithromycin], Codeine, Doxycycline, Levaquin [levofloxacin], and Sulfa antibiotics  Review of Systems   Review of Systems  Constitutional: Positive for fatigue. Negative for fever.  HENT: Positive for dental problem. Negative for congestion and sore throat.   Eyes: Negative for visual disturbance.  Respiratory: Negative for cough and shortness of breath.   Cardiovascular: Positive for chest pain.  Gastrointestinal: Negative for abdominal pain, diarrhea, nausea and vomiting.  Genitourinary: Negative for flank pain.  Musculoskeletal: Negative for back pain and neck pain.  Skin: Negative for rash and wound.  Neurological: Positive for weakness, light-headedness and headaches.  All other systems reviewed and are negative.   Physical Exam Updated Vital Signs BP (!) 178/85 (BP Location: Right Arm)   Pulse (!) 55   Temp 98.7 F (37.1 C)   Resp 16   Ht 5\' 7"  (1.702 m)   Wt 104.3 kg   LMP 09/13/2016   SpO2 98%   BMI 36.02 kg/m   Physical Exam Vitals and nursing note reviewed.  Constitutional:      General: She is not in acute distress.    Appearance: She is well-developed.  HENT:     Head: Normocephalic and atraumatic.     Comments: Status post extraction of left upper molar.  No surrounding edema or erythema.  Dry socket packing is in place. Eyes:     Conjunctiva/sclera: Conjunctivae normal.  Cardiovascular:     Rate and Rhythm: Normal rate and regular rhythm.     Heart sounds: Normal heart sounds. No murmur. No friction rub.  Pulmonary:     Effort: Pulmonary effort is normal. No respiratory distress.     Breath sounds: Normal breath sounds. No wheezing or rales.  Abdominal:     General:  There is no distension.     Palpations: Abdomen is soft.     Tenderness: There is no abdominal tenderness.  Musculoskeletal:     Cervical back: Neck supple.  Skin:    General: Skin is warm.     Capillary Refill: Capillary refill takes less than 2 seconds.  Neurological:     Mental Status: She is alert and oriented to person, place, and time.     Motor: No abnormal muscle tone.     Comments: Neurological Exam:  Mental Status: Alert and oriented to person, place, and time. Attention and concentration normal. Speech clear. Recent memory is intact. Cranial Nerves: Visual fields grossly intact. EOMI and PERRLA. No nystagmus noted. Facial sensation intact at forehead, maxillary cheek, and chin/mandible bilaterally. No facial asymmetry or weakness. Hearing grossly  normal. Uvula is midline, and palate elevates symmetrically. Normal SCM and trapezius strength. Tongue midline without fasciculations. Motor: Muscle strength 5/5 in proximal and distal UE and LE bilaterally. No pronator drift. Muscle tone normal.  Sensation: Intact to light touch in upper and lower extremities distally bilaterally.  Gait: Normal without ataxia. Coordination: Normal FTN bilaterally.        ED Results / Procedures / Treatments   Labs (all labs ordered are listed, but only abnormal results are displayed) Labs Reviewed  CBC - Abnormal; Notable for the following components:      Result Value   Hemoglobin 11.9 (*)    HCT 35.3 (*)    All other components within normal limits  COMPREHENSIVE METABOLIC PANEL  LIPASE, BLOOD  TROPONIN I (HIGH SENSITIVITY)  TROPONIN I (HIGH SENSITIVITY)    EKG None   Normal sinus rhythm, VR 63. PR 150, QRS 76, QTc 425. No acute ST elevations or depressions. No ischemia or infarct.  Radiology DG Chest 2 View  Result Date: 06/21/2019 CLINICAL DATA:  Chest pain. EXAM: CHEST - 2 VIEW COMPARISON:  Chest x-ray dated March 04, 2019. FINDINGS: The heart size and mediastinal contours  are within normal limits. Both lungs are clear. The visualized skeletal structures are unremarkable. IMPRESSION: No active cardiopulmonary disease. Electronically Signed   By: Obie Dredge M.D.   On: 06/21/2019 14:50   CT Head Wo Contrast  Result Date: 06/21/2019 CLINICAL DATA:  Hypertension. EXAM: CT HEAD WITHOUT CONTRAST TECHNIQUE: Contiguous axial images were obtained from the base of the skull through the vertex without intravenous contrast. COMPARISON:  February 19, 2010 FINDINGS: Brain: No evidence of acute infarction, hemorrhage, hydrocephalus, extra-axial collection or mass lesion/mass effect. Vascular: No hyperdense vessel or unexpected calcification. Skull: Normal. Negative for fracture or focal lesion. Sinuses/Orbits: No acute finding. Other: None. IMPRESSION: No acute intracranial pathology. Electronically Signed   By: Aram Candela M.D.   On: 06/21/2019 16:42    Procedures Procedures (including critical care time)  Medications Ordered in ED Medications  sodium chloride flush (NS) 0.9 % injection 3 mL (3 mLs Intravenous Not Given 06/21/19 1739)    ED Course  I have reviewed the triage vital signs and the nursing notes.  Pertinent labs & imaging results that were available during my care of the patient were reviewed by me and considered in my medical decision making (see chart for details).  Clinical Course as of Jun 21 1742  Mon Jun 21, 2019  1713 59 yo F here with mild symptomatic HTN which I suspect is due to pain related to her recent upper molar removal and dry socket. Sx seem related to the painful procedure as well as possible rxn to the local anesthesia w/ epi that was used. No signs of ongoing HTN emergency. No focal neuro deficits and CT head obtained, reviewed, and is negative. Trop neg x 2 with normal EKG, doubt ACS or HTN ischemia. No signs of CHF on CXR. Labs o/w reassuring. Will treat with brief analgesia to help, continue her home antiHTN, and PCP f/u. Pt in  agreement with this plan.   [CI]    Clinical Course User Index [CI] Shaune Pollack, MD   MDM Rules/Calculators/A&P                      As above. Labs, imaging reviewed. BP improved. D/c with PCP f/u.  Final Clinical Impression(s) / ED Diagnoses Final diagnoses:  Essential hypertension    Rx /  DC Orders ED Discharge Orders         Ordered    HYDROcodone-acetaminophen (NORCO/VICODIN) 5-325 MG tablet  Every 6 hours PRN     06/21/19 1717    ondansetron (ZOFRAN ODT) 4 MG disintegrating tablet  Every 8 hours PRN     06/21/19 1717           Shaune Pollack, MD 06/21/19 1746

## 2019-06-21 NOTE — ED Triage Notes (Signed)
See first RN note, pt denies continued chest heaviness at this time. Pt also c/o upper abdominal pain at this time.  Pt states hx of HTN, was told by PCP not to take any more BP meds until they "figued out why her BP was so high".

## 2019-06-22 ENCOUNTER — Emergency Department
Admission: EM | Admit: 2019-06-22 | Discharge: 2019-06-22 | Discharge status: Against Medical Advice | Payer: 59 | Attending: Emergency Medicine | Admitting: Emergency Medicine

## 2019-06-22 ENCOUNTER — Other Ambulatory Visit: Payer: Self-pay

## 2019-06-22 ENCOUNTER — Emergency Department: Payer: 59

## 2019-06-22 DIAGNOSIS — Z5321 Procedure and treatment not carried out due to patient leaving prior to being seen by health care provider: Secondary | ICD-10-CM | POA: Insufficient documentation

## 2019-06-22 DIAGNOSIS — I10 Essential (primary) hypertension: Secondary | ICD-10-CM | POA: Diagnosis not present

## 2019-06-22 LAB — CBC
HCT: 37.7 % (ref 36.0–46.0)
Hemoglobin: 12.3 g/dL (ref 12.0–15.0)
MCH: 29.3 pg (ref 26.0–34.0)
MCHC: 32.6 g/dL (ref 30.0–36.0)
MCV: 89.8 fL (ref 80.0–100.0)
Platelets: 327 10*3/uL (ref 150–400)
RBC: 4.2 MIL/uL (ref 3.87–5.11)
RDW: 14.5 % (ref 11.5–15.5)
WBC: 7.2 10*3/uL (ref 4.0–10.5)
nRBC: 0 % (ref 0.0–0.2)

## 2019-06-22 LAB — BASIC METABOLIC PANEL
Anion gap: 9 (ref 5–15)
BUN: 15 mg/dL (ref 6–20)
CO2: 26 mmol/L (ref 22–32)
Calcium: 9 mg/dL (ref 8.9–10.3)
Chloride: 106 mmol/L (ref 98–111)
Creatinine, Ser: 0.75 mg/dL (ref 0.44–1.00)
GFR calc Af Amer: >60 mL/min (ref >60)
GFR calc non Af Amer: >60 mL/min (ref >60)
Glucose, Bld: 126 mg/dL — ABNORMAL HIGH (ref 70–99)
Potassium: 3.3 mmol/L — ABNORMAL LOW (ref 3.5–5.1)
Sodium: 141 mmol/L (ref 135–145)

## 2019-06-22 LAB — TROPONIN I (HIGH SENSITIVITY): Troponin I (High Sensitivity): 4 ng/L (ref <18)

## 2019-06-22 NOTE — ED Notes (Signed)
No answer when called several times from lobby 

## 2019-06-22 NOTE — ED Triage Notes (Signed)
Pt arrives to ED via [facility] from home with c/o hypertension and headache x1 hr PTA. Pt reports h/x of HTN and takes medications as r/x'd. Pt denies CP, but reports being "anxious and a little short of breath". Pt reports HA is generalized; some light photosensitivity. No c/o recent N/V/D or fevers. Pt reports BP at home was 190/101 prior to EMS arrival. Pt is A&O, in NAD; RR even, regular, and unlabored.

## 2019-07-25 ENCOUNTER — Other Ambulatory Visit: Payer: Self-pay

## 2019-07-25 ENCOUNTER — Ambulatory Visit
Admission: EM | Admit: 2019-07-25 | Discharge: 2019-07-25 | Disposition: A | Discharge status: Home / Self Care | Payer: 59 | Attending: Emergency Medicine | Admitting: Emergency Medicine

## 2019-07-25 ENCOUNTER — Encounter: Payer: Self-pay | Admitting: Emergency Medicine

## 2019-07-25 DIAGNOSIS — J309 Allergic rhinitis, unspecified: Secondary | ICD-10-CM

## 2019-07-25 HISTORY — DX: Essential (primary) hypertension: I10

## 2019-07-25 MED ORDER — LEVOCETIRIZINE DIHYDROCHLORIDE 5 MG PO TABS: 5.0000 mg | ORAL_TABLET | Freq: Every evening | ORAL | 1 refills | Status: DC

## 2019-07-25 MED ORDER — FLUTICASONE PROPIONATE 50 MCG/ACT NA SUSP
2.0000 | Freq: Two times a day (BID) | NASAL | 2 refills | Status: DC
Start: 2019-07-25 — End: 2020-09-21

## 2019-07-25 NOTE — ED Provider Notes (Signed)
[facility] [place] [facility] [place]   Name: Karen Reed DOB: [birth year] MRN: [id number] CSN: [id number] PCP: [facility]  Arrival date and time:  07/25/19 1025  Chief Complaint:  Facial Swelling, Eye Pain, and Nasal Congestion   NOTE: Prior to seeing the patient today, I have reviewed the triage nursing documentation and vital signs. Clinical staff has updated patient's PMH/PSHx, current medication list, and drug allergies/intolerances to ensure comprehensive history available to assist in medical decision making.   History:   HPI: Karen Reed is a 59 y.o. female who presents today with complaints of sinus pressure and pain.  She states the symptoms approximately 3 days ago.  She first noticed increased pressure to bilateral sides of her nose in the center of her head.  She states she tries to blow her nose to remove the drainage and pressure, but she is unable to produce any nasal mucus.  She is also noted some swelling to both her eyes upon awakening.  She denies any fevers or body aches.  She takes Claritin daily and she has not tried any additional over-the-counter medications to treat her symptoms due to her history of high blood pressure and anxiety.  Past Medical History:  Diagnosis Date  . Asthma    Bronchitis induced  . H/O seasonal allergies   . Heart palpitations   . Hypertension   . Sjogren's disease [facility])     Past Surgical History:  Procedure Laterality Date  . NO PAST SURGERIES      Family History  Problem Relation Age of Onset  . Other Mother        unknown medical history  . Heart failure Father   . Hypertension Father     Social History   Tobacco Use  . Smoking status: Current Every Day Smoker    Packs/day: 1.00    Types: Cigarettes  . Smokeless tobacco: Never Used  Vaping Use  . Vaping Use: Never used  Substance Use Topics  . Alcohol use: No  . Drug use: No    Patient Active Problem List   Diagnosis Date Noted  .  Palpitations 02/12/2018    Home Medications:    Current Meds  Medication Sig  . atorvastatin (LIPITOR) 20 MG tablet Take 20 mg by mouth daily.  Marland Kitchen losartan (COZAAR) 25 MG tablet Take 25 mg by mouth daily.  . [DISCONTINUED] loratadine (CLARITIN) 10 MG tablet TAKE 1 TABLET BY MOUTH EVERY DAY    Allergies:   Atorvastatin, Biaxin [clarithromycin], Codeine, Doxycycline, Levaquin [levofloxacin], Lisinopril, and Sulfa antibiotics  Review of Systems (ROS): Review of Systems  Constitutional: Negative for chills, fatigue and fever.  HENT: Positive for congestion, postnasal drip and sinus pressure. Negative for ear discharge, ear pain, sinus pain and sore throat.   Eyes: Negative for discharge and itching.  Gastrointestinal: Negative for nausea.  All other systems reviewed and are negative.    Vital Signs: Today's Vitals   07/25/19 1043 07/25/19 1044 07/25/19 1045 07/25/19 1106  BP:  (!) 163/88    Pulse:  77    Resp:  18    Temp:  97.8 F (36.6 C)    TempSrc:  Oral    SpO2:  98%    Weight:   230 lb (104.3 kg)   Height:   5\' 7"  (1.702 m)   PainSc: 4    4     Physical Exam: Physical Exam Vitals and nursing note reviewed.  Constitutional:  General: She is not in acute distress.    Appearance: She is well-developed.  HENT:     Head: Normocephalic and atraumatic.     Right Ear: Tympanic membrane normal. No middle ear effusion.     Left Ear: Tympanic membrane normal.  No middle ear effusion.     Nose:     Right Turbinates: Enlarged and swollen.     Right Sinus: No maxillary sinus tenderness or frontal sinus tenderness.     Left Sinus: No maxillary sinus tenderness or frontal sinus tenderness.     Comments: Sinus pressure noted with palpitation.    Mouth/Throat:     Pharynx: Posterior oropharyngeal erythema present.     Tonsils: No tonsillar exudate or tonsillar abscesses.  Eyes:     Conjunctiva/sclera: Conjunctivae normal.  Cardiovascular:     Rate and Rhythm: Normal  rate and regular rhythm.     Heart sounds: No murmur heard.   Pulmonary:     Effort: Pulmonary effort is normal. No respiratory distress.     Breath sounds: Normal breath sounds.  Abdominal:     Palpations: Abdomen is soft.     Tenderness: There is no abdominal tenderness.  Musculoskeletal:     Cervical back: Neck supple.  Lymphadenopathy:     Cervical: No cervical adenopathy.  Skin:    General: Skin is warm and dry.  Neurological:     Mental Status: She is alert.      Urgent Care Treatments / Results:   LABS: PLEASE NOTE: all labs that were ordered this encounter are listed, however only abnormal results are displayed. Labs Reviewed - No data to display  EKG: -None  RADIOLOGY: No results found.  PROCEDURES: Procedures  MEDICATIONS RECEIVED THIS VISIT: Medications - No data to display  PERTINENT CLINICAL COURSE NOTES/UPDATES:   Initial Impression / Assessment and Plan / Urgent Care Course:  Pertinent labs & imaging results that were available during my care of the patient were personally reviewed by me and considered in my medical decision making (see lab/imaging section of note for values and interpretations).  Karen Reed is a 59 y.o. female who presents to [facility] today with complaints of sinus pressure, diagnosed with allergic sinusitis, and treated as such with the medications below. NP and patient reviewed discharge instructions below during visit.   Patient is well appearing overall in clinic today. She does not appear to be in any acute distress. Presenting symptoms (see HPI) and exam as documented above.   I have reviewed the follow up and strict return precautions for any new or worsening symptoms. Patient is aware of symptoms that would be deemed urgent/emergent, and would thus require further evaluation either here or in the emergency department. At the time of discharge, she verbalized understanding and consent with the discharge plan as it  was reviewed with her. All questions were fielded by provider and/or clinic staff prior to patient discharge.    Final Clinical Impressions / Urgent Care Diagnoses:   Final diagnoses:  Allergic sinusitis    New Prescriptions:  [place] Controlled Substance Registry consulted? Not Applicable  Meds ordered this encounter  Medications  . levocetirizine (XYZAL) 5 MG tablet    Sig: Take 1 tablet (5 mg total) by mouth every evening.    Dispense:  30 tablet    Refill:  1  . fluticasone (FLONASE) 50 MCG/ACT nasal spray    Sig: Place 2 sprays into both nostrils in the morning and at bedtime.  Dispense:  18.2 mL    Refill:  2      Discharge Instructions     You were seen for sinus pressure and are being treated for allergic sinusitis.   Take your medications as prescribed.  Make sure you follow-up if your symptoms do not resolve or if they worsen.  Using cold compress to your eye can also help reduce inflammation.  Over-the-counter guaifenesin (Mucinex) can also help loosen some secretions.    Make sure you stay away from decongestants such as Sudafed because those can increase your blood pressure and anxiety.  Take care, Dr. Sharlet Salina, NP-c     Recommended Follow up Care:  Patient encouraged to follow up with the following provider within the specified time frame, or sooner as dictated by the severity of her symptoms. As always, she was instructed that for any urgent/emergent care needs, she should seek care either here or in the emergency department for more immediate evaluation.   Bailey Mech, DNP, NP-c    Bailey Mech, NP 07/25/19 1114

## 2019-07-25 NOTE — Discharge Instructions (Signed)
You were seen for sinus pressure and are being treated for allergic sinusitis.   Take your medications as prescribed.  Make sure you follow-up if your symptoms do not resolve or if they worsen.  Using cold compress to your eye can also help reduce inflammation.  Over-the-counter guaifenesin (Mucinex) can also help loosen some secretions.    Make sure you stay away from decongestants such as Sudafed because those can increase your blood pressure and anxiety.  Take care, Dr. Sharlet Salina, NP-c

## 2019-07-25 NOTE — ED Triage Notes (Signed)
Patient in today c/o nasal congestion, eye swelling and pain x 3 days. Patient states when she bends over she feels like there is a lot of fluid in her head.

## 2019-08-09 DIAGNOSIS — M792 Neuralgia and neuritis, unspecified: Secondary | ICD-10-CM | POA: Insufficient documentation

## 2019-08-13 DIAGNOSIS — E782 Mixed hyperlipidemia: Secondary | ICD-10-CM | POA: Insufficient documentation

## 2019-08-13 DIAGNOSIS — M5430 Sciatica, unspecified side: Secondary | ICD-10-CM | POA: Insufficient documentation

## 2019-08-17 ENCOUNTER — Other Ambulatory Visit: Payer: Self-pay

## 2019-08-17 ENCOUNTER — Encounter: Payer: Self-pay | Admitting: Emergency Medicine

## 2019-08-17 ENCOUNTER — Ambulatory Visit
Admission: EM | Admit: 2019-08-17 | Discharge: 2019-08-17 | Disposition: A | Discharge status: Home / Self Care | Payer: 59 | Attending: Family Medicine | Admitting: Family Medicine

## 2019-08-17 DIAGNOSIS — M25512 Pain in left shoulder: Secondary | ICD-10-CM

## 2019-08-17 DIAGNOSIS — M7752 Other enthesopathy of left foot: Secondary | ICD-10-CM | POA: Diagnosis not present

## 2019-08-17 MED ORDER — MELOXICAM 15 MG PO TABS: 15.0000 mg | ORAL_TABLET | Freq: Every day | ORAL | 0 refills | Status: DC | PRN

## 2019-08-17 NOTE — Discharge Instructions (Signed)
Rest, ice.  Be sure to try and do some gentle range of motion of the shoulder.  Medication as prescribed.  Please call [facility] Orthopedics [phone]) OR [facility] [phone]) for an appt if this persists.

## 2019-08-17 NOTE — ED Triage Notes (Signed)
Patient states she turned a certain way last night and she injured her left shoulder.  She also reports a knot on the back of her left heel that started about 2 weeks ago.

## 2019-08-17 NOTE — ED Provider Notes (Signed)
[facility]    CSN: [id number] Arrival date & time: 08/17/19  1718      History   Chief Complaint Chief Complaint  Patient presents with  . Shoulder Pain  . Foot Pain   HPI  59 year old female presents with the above complaints.  Patient reports that she has been experiencing left shoulder pain since yesterday.  She reports anterior pain and decreased range of motion.  She states that she is not able to lift her shoulder.  She states that she has had prior issues with rotator cuff problems.  Has never had rotator cuff surgery.  Patient reports that she turned yesterday and this is what incited the pain.  She does not recall recent trauma, fall, or injury.  No relieving factors.  Additionally, patient reports right heel pain posteriorly.  She states that the area appears to be swollen.  It has been bothering her for the past 2 weeks.  Past Medical History:  Diagnosis Date  . Asthma    Bronchitis induced  . H/O seasonal allergies   . Heart palpitations   . Hypertension   . Sjogren's disease [facility])    Patient Active Problem List   Diagnosis Date Noted  . Palpitations 02/12/2018   Past Surgical History:  Procedure Laterality Date  . NO PAST SURGERIES     OB History   No obstetric history on file.      Home Medications    Prior to Admission medications   Medication Sig Start Date End Date Taking? Authorizing Provider  atorvastatin (LIPITOR) 20 MG tablet Take 20 mg by mouth daily. 06/19/19  Yes [provider]  fluticasone (FLONASE) 50 MCG/ACT nasal spray Place 2 sprays into both nostrils in the morning and at bedtime. 07/25/19  Yes Bailey Mech, NP  levocetirizine (XYZAL) 5 MG tablet Take 1 tablet (5 mg total) by mouth every evening. 07/25/19  Yes Bailey Mech, NP  losartan (COZAAR) 25 MG tablet Take 25 mg by mouth daily. 05/14/19  Yes [provider]  meloxicam (MOBIC) 15 MG tablet Take 1 tablet (15 mg total) by mouth daily as needed for  pain. 08/17/19   Tommie Sams, DO  famotidine (PEPCID) 20 MG tablet Take 1 tablet (20 mg total) by mouth daily. 11/17/18 06/19/19  Phineas Semen, MD  sucralfate (CARAFATE) 1 g tablet Take 1 tablet (1 g total) by mouth 4 (four) times daily. 11/17/18 06/19/19  Phineas Semen, MD    Family History Family History  Problem Relation Age of Onset  . Other Mother        unknown medical history  . Heart failure Father   . Hypertension Father     Social History Social History   Tobacco Use  . Smoking status: Current Every Day Smoker    Packs/day: 1.00    Types: Cigarettes  . Smokeless tobacco: Never Used  Vaping Use  . Vaping Use: Never used  Substance Use Topics  . Alcohol use: No  . Drug use: No     Allergies   Atorvastatin, Biaxin [clarithromycin], Codeine, Levaquin [levofloxacin], Lisinopril, and Sulfa antibiotics   Review of Systems Review of Systems  Musculoskeletal:       Left shoulder pain, Left heel pain.   Physical Exam Triage Vital Signs ED Triage Vitals  Enc Vitals Group     BP 08/17/19 1759 121/78     Pulse Rate 08/17/19 1759 73     Resp 08/17/19 1759 18     Temp 08/17/19  1759 98.2 F (36.8 C)     Temp Source 08/17/19 1759 Oral     SpO2 08/17/19 1759 100 %     Weight 08/17/19 1757 227 lb (103 kg)     Height 08/17/19 1757 5\' 7"  (1.702 m)     Head Circumference --      Peak Flow --      Pain Score 08/17/19 1757 8     Pain Loc --      Pain Edu? --      Excl. in GC? --    Updated Vital Signs BP 121/78 (BP Location: Right Arm)   Pulse 73   Temp 98.2 F (36.8 C) (Oral)   Resp 18   Ht 5\' 7"  (1.702 m)   Wt 103 kg   LMP 09/13/2016   SpO2 100%   BMI 35.55 kg/m   Visual Acuity Right Eye Distance:   Left Eye Distance:   Bilateral Distance:    Right Eye Near:   Left Eye Near:    Bilateral Near:     Physical Exam Vitals and nursing note reviewed.  Constitutional:      General: She is not in acute distress.    Appearance: Normal appearance. She  is not ill-appearing.  HENT:     Head: Normocephalic and atraumatic.  Eyes:     General:        Right eye: No discharge.        Left eye: No discharge.     Conjunctiva/sclera: Conjunctivae normal.  Pulmonary:     Effort: Pulmonary effort is normal. No respiratory distress.  Musculoskeletal:       Feet:     Comments: Left shoulder: Markedly decreased range of motion in all planes secondary to pain.  Tenderness over the bicipital groove.  Feet:     Comments: Patient with mild tenderness and mild swelling of the labeled location Neurological:     Mental Status: She is alert.  Psychiatric:        Mood and Affect: Mood normal.        Behavior: Behavior normal.    UC Treatments / Results  Labs (all labs ordered are listed, but only abnormal results are displayed) Labs Reviewed - No data to display  EKG   Radiology No results found.  Procedures Procedures (including critical care time)  Medications Ordered in UC Medications - No data to display  Initial Impression / Assessment and Plan / UC Course  I have reviewed the triage vital signs and the nursing notes.  Pertinent labs & imaging results that were available during my care of the patient were reviewed by me and considered in my medical decision making (see chart for details).    59 year old female presents with left shoulder pain as well as left heel pain.  Patient with tenderness over the bicipital groove suggestive of biceps tendinitis.  Patient also having marked decreased range of motion was suggested underlying rotator cuff issue.  Meloxicam as directed.  Advised gentle range of motion.  If persists, will need to see orthopedics.  Regarding her heel pain, this appears to be consistent with Achilles enthesopathy versus calcaneal bursitis.  Meloxicam as directed.,  Rest, ice.  Final Clinical Impressions(s) / UC Diagnoses   Final diagnoses:  Left calcaneal bursitis  Acute pain of left shoulder     Discharge  Instructions     Rest, ice.  Be sure to try and do some gentle range of motion of the shoulder.  Medication  as prescribed.  Please call [facility] clinic Orthopedics [phone]) OR [facility] [phone]) for an appt if this persists.     ED Prescriptions    Medication Sig Dispense Auth. Provider   meloxicam (MOBIC) 15 MG tablet Take 1 tablet (15 mg total) by mouth daily as needed for pain. 30 tablet Tommie Sams, DO     PDMP not reviewed this encounter.   Tommie Sams, [place] 08/17/19 1909

## 2019-09-08 DIAGNOSIS — F430 Acute stress reaction: Secondary | ICD-10-CM | POA: Insufficient documentation

## 2019-09-08 DIAGNOSIS — F4320 Adjustment disorder, unspecified: Secondary | ICD-10-CM | POA: Insufficient documentation

## 2019-11-03 ENCOUNTER — Other Ambulatory Visit: Payer: Self-pay

## 2019-11-03 ENCOUNTER — Ambulatory Visit
Admission: EM | Admit: 2019-11-03 | Discharge: 2019-11-03 | Disposition: A | Discharge status: Home / Self Care | Payer: 59

## 2019-11-03 DIAGNOSIS — Z111 Encounter for screening for respiratory tuberculosis: Secondary | ICD-10-CM | POA: Diagnosis not present

## 2019-11-03 NOTE — ED Triage Notes (Signed)
Patient here for TB skin placement  Placed to left forearm  Patient will return Friday after 10:30am and before Saturday 10:30am.

## 2019-11-05 ENCOUNTER — Ambulatory Visit
Admission: EM | Admit: 2019-11-05 | Discharge: 2019-11-05 | Disposition: A | Discharge status: Home / Self Care | Payer: 59 | Attending: Family Medicine | Admitting: Family Medicine

## 2019-11-05 ENCOUNTER — Other Ambulatory Visit: Payer: Self-pay

## 2019-11-05 DIAGNOSIS — Z111 Encounter for screening for respiratory tuberculosis: Secondary | ICD-10-CM

## 2019-11-05 NOTE — ED Triage Notes (Signed)
Patient here for PPD skin test reading.  Patient's PPD skin test read on 11/05/19 at 1840 was 26mm Negative.

## 2020-01-04 ENCOUNTER — Ambulatory Visit (INDEPENDENT_AMBULATORY_CARE_PROVIDER_SITE_OTHER): Payer: 59 | Admitting: Podiatry

## 2020-01-04 ENCOUNTER — Other Ambulatory Visit: Payer: Self-pay

## 2020-01-04 DIAGNOSIS — M7662 Achilles tendinitis, left leg: Secondary | ICD-10-CM

## 2020-01-04 MED ORDER — METHYLPREDNISOLONE 4 MG PO TBPK
ORAL_TABLET | ORAL | 0 refills | Status: DC
Start: 2020-01-04 — End: 2020-04-07

## 2020-01-04 MED ORDER — BETAMETHASONE SOD PHOS & ACET 6 (3-3) MG/ML IJ SUSP
3.0000 mg | Freq: Once | INTRAMUSCULAR | Status: AC
  Administered 2020-01-04: 09:00:00 3 mg via INTRAMUSCULAR

## 2020-01-04 MED ORDER — MELOXICAM 15 MG PO TABS: 15.0000 mg | ORAL_TABLET | Freq: Every day | ORAL | 1 refills | Status: DC

## 2020-01-04 NOTE — Progress Notes (Signed)
   HPI: 59 y.o. female presenting today presenting as a new patient for evaluation of left heel pain is been going on for approximately 6 months now.  Patient states that she experiences aching and burning sensations.  She has not done anything for treatment.  Aggravated by walking.  She cannot recall a history of injury or what may have caused the pain initially   Past Medical History:  Diagnosis Date  . Asthma    Bronchitis induced  . H/O seasonal allergies   . Heart palpitations   . Hypertension   . Sjogren's disease [facility])       Physical Exam: General: The patient is alert and oriented x3 in no acute distress.  Dermatology: Skin is warm, dry and supple bilateral lower extremities. Negative for open lesions or macerations.  Vascular: Palpable pedal pulses bilaterally. No edema or erythema noted. Capillary refill within normal limits.  Neurological: Epicritic and protective threshold grossly intact bilaterally.   Musculoskeletal Exam: Pain on palpation noted to the posterior tubercle of the left calcaneus at the insertion of the Achilles tendon consistent with retrocalcaneal bursitis. Range of motion within normal limits. Muscle strength 5/5 in all muscle groups bilateral lower extremities.  Radiographic Exam:  Negative for posterior calcaneal spur to the respective calcaneus on lateral view.  There are some asymptomatic plantar heel spurs noted.  No fracture or dislocation noted. Normal osseous mineralization noted.     Assessment: 1. Insertional Achilles tendinitis left 2. Retrocalcaneal bursitis   Plan of Care:  1. Patient was evaluated. Radiographs were reviewed today. 2. Injection of 0.5 mL Celestone Soluspan injected into the retrocalcaneal bursa. Care was taken to avoid direct injection into the Achilles tendon. 3.  Prescription for Medrol Dosepak 4.  Prescription for meloxicam 15 mg daily to begin taking after completion of the Dosepak 5.  Cam boot dispensed.  Wear  daily 6.  Return to clinic in 4 weeks  *Home health aide   Felecia Shelling, DPM [facility]  Dr. Felecia Shelling, DPM    [address] [place], [place] [address]                Office [phone]  Fax [phone]

## 2020-01-11 ENCOUNTER — Emergency Department
Admission: EM | Admit: 2020-01-11 | Discharge: 2020-01-11 | Disposition: A | Discharge status: Home / Self Care | Payer: 59

## 2020-01-11 ENCOUNTER — Ambulatory Visit
Admission: EM | Admit: 2020-01-11 | Discharge: 2020-01-11 | Disposition: A | Discharge status: Other Acute Inpatient Hospital | Payer: 59

## 2020-01-20 ENCOUNTER — Other Ambulatory Visit: Payer: 59

## 2020-02-08 ENCOUNTER — Ambulatory Visit: Payer: 59 | Admitting: Podiatry

## 2020-02-18 ENCOUNTER — Encounter: Payer: Self-pay | Admitting: Podiatry

## 2020-02-18 ENCOUNTER — Ambulatory Visit (INDEPENDENT_AMBULATORY_CARE_PROVIDER_SITE_OTHER): Payer: 59 | Admitting: Podiatry

## 2020-02-18 ENCOUNTER — Other Ambulatory Visit: Payer: Self-pay

## 2020-02-18 DIAGNOSIS — M7662 Achilles tendinitis, left leg: Secondary | ICD-10-CM

## 2020-02-18 NOTE — Progress Notes (Signed)
   HPI: 60 y.o. female presenting today presenting for follow-up evaluation of Achilles tendinitis to the left lower extremity.  Patient states she is doing much better.  She has no pain associated to the posterior heel.  She has been walking in tennis shoes for the last week and doing very well with no pain.  Past Medical History:  Diagnosis Date  . Asthma    Bronchitis induced  . H/O seasonal allergies   . Heart palpitations   . Hypertension   . Sjogren's disease [facility])       Physical Exam: General: The patient is alert and oriented x3 in no acute distress.  Dermatology: Skin is warm, dry and supple bilateral lower extremities. Negative for open lesions or macerations.  Vascular: Palpable pedal pulses bilaterally. No edema or erythema noted. Capillary refill within normal limits.  Neurological: Epicritic and protective threshold grossly intact bilaterally.   Musculoskeletal Exam: Negative for any significant pain on palpation noted to the posterior tubercle of the left calcaneus at the insertion of the Achilles tendon consistent with retrocalcaneal bursitis. Range of motion within normal limits. Muscle strength 5/5 in all muscle groups bilateral lower extremities.     Assessment: 1. Insertional Achilles tendinitis left 2. Retrocalcaneal bursitis   Plan of Care:  1. Patient was evaluated.  Overall the patient is doing very well and has no more pain associated to the Achilles tendon to the left lower extremity 2.  Recommend daily stretching exercises to the Achilles tendon and calf 3.  Recommend good supportive shoes with a slight heel lift 4.  Continue meloxicam as needed 5.  Patient may discontinue cam boot. 6.  Return to clinic as needed  *Home health aide   Felecia Shelling, DPM [facility]  Dr. Felecia Shelling, DPM    [address] [place], [place] [address]                Office [phone]  Fax [phone]

## 2020-03-25 ENCOUNTER — Other Ambulatory Visit: Payer: Self-pay | Admitting: Podiatry

## 2020-03-25 NOTE — Telephone Encounter (Signed)
Will refill further refills per Dr. Logan Bores

## 2020-04-07 ENCOUNTER — Encounter: Payer: Self-pay | Admitting: Emergency Medicine

## 2020-04-07 ENCOUNTER — Other Ambulatory Visit: Payer: Self-pay

## 2020-04-07 ENCOUNTER — Emergency Department
Admission: EM | Admit: 2020-04-07 | Discharge: 2020-04-07 | Disposition: A | Discharge status: Home / Self Care | Payer: 59 | Attending: Emergency Medicine | Admitting: Emergency Medicine

## 2020-04-07 DIAGNOSIS — F17210 Nicotine dependence, cigarettes, uncomplicated: Secondary | ICD-10-CM | POA: Diagnosis not present

## 2020-04-07 DIAGNOSIS — L03211 Cellulitis of face: Secondary | ICD-10-CM | POA: Insufficient documentation

## 2020-04-07 DIAGNOSIS — Z79899 Other long term (current) drug therapy: Secondary | ICD-10-CM | POA: Insufficient documentation

## 2020-04-07 DIAGNOSIS — J45909 Unspecified asthma, uncomplicated: Secondary | ICD-10-CM | POA: Diagnosis not present

## 2020-04-07 DIAGNOSIS — I10 Essential (primary) hypertension: Secondary | ICD-10-CM | POA: Insufficient documentation

## 2020-04-07 DIAGNOSIS — R220 Localized swelling, mass and lump, head: Secondary | ICD-10-CM | POA: Diagnosis present

## 2020-04-07 MED ORDER — METHYLPREDNISOLONE SODIUM SUCC 125 MG IJ SOLR: 80.0000 mg | Freq: Once | INTRAMUSCULAR | Status: DC

## 2020-04-07 MED ORDER — PREDNISONE 20 MG PO TABS: 60.0000 mg | ORAL_TABLET | Freq: Every day | ORAL | 0 refills | Status: AC

## 2020-04-07 MED ORDER — AMOXICILLIN-POT CLAVULANATE 875-125 MG PO TABS: 1.0000 | ORAL_TABLET | Freq: Two times a day (BID) | ORAL | 0 refills | Status: AC

## 2020-04-07 MED ORDER — DIPHENHYDRAMINE HCL 50 MG/ML IJ SOLN: 50.0000 mg | Freq: Once | INTRAMUSCULAR | Status: DC

## 2020-04-07 NOTE — Discharge Instructions (Addendum)
Take the antibiotics as prescribed in case this is a developing infection.  You may try Benadryl and prednisone in case this is an allergic reaction.  If the swelling is getting worse or expanding, if you have a fever, if your tongue is swollen or you have any difficulty breathing please return to the emergency room immediately.  Otherwise follow-up with your dentist on Monday.

## 2020-04-07 NOTE — ED Notes (Signed)
Pt is stating she does not want to take the Benadryl due tot the fact that she drove here. Dr. Don Perking advised

## 2020-04-07 NOTE — ED Notes (Signed)
Pt states she had dental work yesterday and after taking tylenol and motrin her swelling in the upper lip and nose region is swollen. Pt states she has never had this happen before.

## 2020-04-07 NOTE — ED Triage Notes (Signed)
Patient ambulatory to triage with steady gait, without difficulty or distress noted; pt reports swelling to upper lip and nose; st yesterday had 2 crowns placed on front teeth

## 2020-04-07 NOTE — ED Provider Notes (Signed)
[facility] Emergency Department Provider Note  ____________________________________________  Time seen: Approximately 6:04 AM  I have reviewed the triage vital signs and the nursing notes.   HISTORY  Chief Complaint Facial Swelling   HPI Karen Reed is a 60 y.o. female who presents for evaluation of lip swelling.  Patient reports having lidocaine injected right above her 2 frontal teeth for crowns that were placed yesterday.  An hour after she went home she noticed that her upper lip was swollen.  She reports that the swelling has gotten worse this morning when she woke up.  The swelling is only on the left upper lip.  No tongue swelling, no wheezing, no difficulty breathing, no vomiting or diarrhea.  Patient does report having a similar episode in the past with the dentist that develop into an infection of the lip due to the injection of lidocaine.     Past Medical History:  Diagnosis Date  . Asthma    Bronchitis induced  . H/O seasonal allergies   . Heart palpitations   . Hypertension   . Sjogren's disease [facility])     Patient Active Problem List   Diagnosis Date Noted  . Palpitations 02/12/2018    Past Surgical History:  Procedure Laterality Date  . NO PAST SURGERIES      Prior to Admission medications   Medication Sig Start Date End Date Taking? Authorizing Provider  amoxicillin-clavulanate (AUGMENTIN) 875-125 MG tablet Take 1 tablet by mouth 2 (two) times daily for 7 days. 04/07/20 04/14/20 Yes Veronese, [place], MD  predniSONE (DELTASONE) 20 MG tablet Take 3 tablets (60 mg total) by mouth daily with breakfast for 5 days. 04/07/20 04/12/20 Yes Veronese, [place], MD  atorvastatin (LIPITOR) 20 MG tablet Take 20 mg by mouth daily. 06/19/19   [provider]  fluticasone (FLONASE) 50 MCG/ACT nasal spray Place 2 sprays into both nostrils in the morning and at bedtime. 07/25/19   Bailey Mech, NP  levocetirizine (XYZAL) 5 MG tablet Take 1  tablet (5 mg total) by mouth every evening. 07/25/19   Bailey Mech, NP  losartan (COZAAR) 25 MG tablet Take 25 mg by mouth daily. 05/14/19   [provider]  meloxicam (MOBIC) 15 MG tablet TAKE 1 TABLET (15 MG TOTAL) BY MOUTH DAILY. 03/25/20   Park Liter, DPM  famotidine (PEPCID) 20 MG tablet Take 1 tablet (20 mg total) by mouth daily. 11/17/18 06/19/19  Phineas Semen, MD  sucralfate (CARAFATE) 1 g tablet Take 1 tablet (1 g total) by mouth 4 (four) times daily. 11/17/18 06/19/19  Phineas Semen, MD    Allergies Atorvastatin, Biaxin [clarithromycin], Codeine, Levaquin [levofloxacin], Lisinopril, and Sulfa antibiotics  Family History  Problem Relation Age of Onset  . Other Mother        unknown medical history  . Heart failure Father   . Hypertension Father     Social History Social History   Tobacco Use  . Smoking status: Current Every Day Smoker    Packs/day: 1.00    Types: Cigarettes  . Smokeless tobacco: Never Used  Vaping Use  . Vaping Use: Never used  Substance Use Topics  . Alcohol use: No  . Drug use: No    Review of Systems  Constitutional: Negative for fever. Eyes: Negative for visual changes. ENT: Negative for sore throat. + upper lip swelling Neck: No neck pain  Cardiovascular: Negative for chest pain. Respiratory: Negative for shortness of breath. Gastrointestinal: Negative for abdominal pain, vomiting or diarrhea.  Genitourinary: Negative for dysuria. Musculoskeletal: Negative for back pain. Skin: Negative for rash. Neurological: Negative for headaches, weakness or numbness. Psych: No SI or HI  ____________________________________________   PHYSICAL EXAM:  VITAL SIGNS: ED Triage Vitals  Enc Vitals Group     BP 04/07/20 0545 123/64     Pulse Rate 04/07/20 0545 (!) 106     Resp 04/07/20 0545 20     Temp 04/07/20 0545 98.5 F (36.9 C)     Temp Source 04/07/20 0545 Oral     SpO2 04/07/20 0545 97 %     Weight --      Height --       Head Circumference --      Peak Flow --      Pain Score 04/07/20 0539 0     Pain Loc --      Pain Edu? --      Excl. in GC? --     Constitutional: Alert and oriented. Well appearing and in no apparent distress. HEENT:      Head: Normocephalic and atraumatic.         Eyes: Conjunctivae are normal. Sclera is non-icteric.       Mouth/Throat: Mucous membranes are moist. Upper lip is swollen all the way to the base of the nose with slight erythema and warmth, slightly tender to palpation. Several cavities. No abscess noted. No tongue or uvula swelling. Airway is patent, no stridor      Neck: Supple with no signs of meningismus. Cardiovascular: Regular rate and rhythm.  Respiratory: Normal respiratory effort. Lungs are clear to auscultation bilaterally.  Musculoskeletal:  No edema, cyanosis, or erythema of extremities. Neurologic: Normal speech and language. Face is symmetric. Moving all extremities. No gross focal neurologic deficits are appreciated. Skin: Skin is warm, dry and intact. No rash noted. Psychiatric: Mood and affect are normal. Speech and behavior are normal.  ____________________________________________   LABS (all labs ordered are listed, but only abnormal results are displayed)  Labs Reviewed - No data to display ____________________________________________  EKG  none  ____________________________________________  RADIOLOGY  none  ____________________________________________   PROCEDURES  Procedure(s) performed: None Procedures Critical Care performed:  None ____________________________________________   INITIAL IMPRESSION / ASSESSMENT AND PLAN / ED COURSE   60 y.o. female who presents for evaluation of lip swelling that started an hour after she had dental work yesterday and had lidocaine injected for a dental block.  Patient's lip is swollen all the way to the base of the nose bilaterally with mild warmth and erythema.  Possibly early developing  cellulitis versus an allergic reaction to lidocaine.  No signs of anaphylaxis.  No signs of an abscess.  No signs of Ludwig's angina.  No signs of intraoral involvement.  Patient refused IV medication.  Will discharge home on Augmentin, prednisone, Benadryl.  Discussed close follow-up with her dentist on Monday.  Recommend return to the emergency room if the swelling and the redness become more pronounced, if she has any involvement intraorally, if she has any difficulty breathing, or fever at home.      _____________________________________________ Please note:  Patient was evaluated in Emergency Department today for the symptoms described in the history of present illness. Patient was evaluated in the context of the global COVID-19 pandemic, which necessitated consideration that the patient might be at risk for infection with the SARS-CoV-2 virus that causes COVID-19. Institutional protocols and algorithms that pertain to the evaluation of patients at risk for COVID-19 are in a  state of rapid change based on information released by regulatory bodies including the CDC and federal and state organizations. These policies and algorithms were followed during the patient's care in the ED.  Some ED evaluations and interventions may be delayed as a result of limited staffing during the pandemic.   [place] Controlled Substance Database was reviewed by me. ____________________________________________   FINAL CLINICAL IMPRESSION(S) / ED DIAGNOSES   Final diagnoses:  Facial cellulitis      NEW MEDICATIONS STARTED DURING THIS VISIT:  ED Discharge Orders         Ordered    amoxicillin-clavulanate (AUGMENTIN) 875-125 MG tablet  2 times daily        04/07/20 0603    predniSONE (DELTASONE) 20 MG tablet  Daily with breakfast        04/07/20 0603           Note:  This document was prepared using Dragon voice recognition software and may include unintentional dictation errors.    Nita Sickle,  MD 04/07/20 [phone]

## 2020-04-10 ENCOUNTER — Other Ambulatory Visit: Payer: Self-pay

## 2020-04-10 DIAGNOSIS — R079 Chest pain, unspecified: Secondary | ICD-10-CM | POA: Insufficient documentation

## 2020-04-10 DIAGNOSIS — M25511 Pain in right shoulder: Secondary | ICD-10-CM | POA: Insufficient documentation

## 2020-04-10 DIAGNOSIS — Z5321 Procedure and treatment not carried out due to patient leaving prior to being seen by health care provider: Secondary | ICD-10-CM | POA: Diagnosis not present

## 2020-04-10 DIAGNOSIS — M542 Cervicalgia: Secondary | ICD-10-CM | POA: Insufficient documentation

## 2020-04-10 DIAGNOSIS — M25512 Pain in left shoulder: Secondary | ICD-10-CM | POA: Insufficient documentation

## 2020-04-10 LAB — CBC
HCT: 38.6 % (ref 36.0–46.0)
Hemoglobin: 12.7 g/dL (ref 12.0–15.0)
MCH: 29.5 pg (ref 26.0–34.0)
MCHC: 32.9 g/dL (ref 30.0–36.0)
MCV: 89.8 fL (ref 80.0–100.0)
Platelets: 357 10*3/uL (ref 150–400)
RBC: 4.3 MIL/uL (ref 3.87–5.11)
RDW: 14.6 % (ref 11.5–15.5)
WBC: 9 10*3/uL (ref 4.0–10.5)
nRBC: 0 % (ref 0.0–0.2)

## 2020-04-10 LAB — COMPREHENSIVE METABOLIC PANEL
ALT: 16 U/L (ref 0–44)
AST: 19 U/L (ref 15–41)
Albumin: 3.6 g/dL (ref 3.5–5.0)
Alkaline Phosphatase: 74 U/L (ref 38–126)
Anion gap: 8 (ref 5–15)
BUN: 12 mg/dL (ref 6–20)
CO2: 26 mmol/L (ref 22–32)
Calcium: 9.2 mg/dL (ref 8.9–10.3)
Chloride: 106 mmol/L (ref 98–111)
Creatinine, Ser: 0.71 mg/dL (ref 0.44–1.00)
GFR, Estimated: >60 mL/min (ref >60)
Glucose, Bld: 101 mg/dL — ABNORMAL HIGH (ref 70–99)
Potassium: 3.9 mmol/L (ref 3.5–5.1)
Sodium: 140 mmol/L (ref 135–145)
Total Bilirubin: 0.5 mg/dL (ref 0.3–1.2)
Total Protein: 7.6 g/dL (ref 6.5–8.1)

## 2020-04-10 LAB — TROPONIN I (HIGH SENSITIVITY): Troponin I (High Sensitivity): 2 ng/L (ref <18)

## 2020-04-10 NOTE — ED Triage Notes (Signed)
Pt in with co bilat shoulder pain, states starts in neck and radiates down both shoulder. States also to left chest at times, states feels like tightness, no hx of the same.

## 2020-04-11 ENCOUNTER — Emergency Department
Admission: EM | Admit: 2020-04-11 | Discharge: 2020-04-11 | Disposition: A | Discharge status: Home / Self Care | Payer: 59 | Attending: Emergency Medicine | Admitting: Emergency Medicine

## 2020-04-17 ENCOUNTER — Encounter: Payer: Self-pay | Admitting: Emergency Medicine

## 2020-04-17 ENCOUNTER — Other Ambulatory Visit: Payer: Self-pay

## 2020-04-17 ENCOUNTER — Ambulatory Visit
Admission: EM | Admit: 2020-04-17 | Discharge: 2020-04-17 | Disposition: A | Discharge status: Home / Self Care | Payer: 59 | Attending: Family Medicine | Admitting: Family Medicine

## 2020-04-17 DIAGNOSIS — R220 Localized swelling, mass and lump, head: Secondary | ICD-10-CM

## 2020-04-17 MED ORDER — AMOXICILLIN-POT CLAVULANATE 875-125 MG PO TABS: 1.0000 | ORAL_TABLET | Freq: Two times a day (BID) | ORAL | 0 refills | Status: DC

## 2020-04-17 NOTE — ED Provider Notes (Signed)
[facility]    CSN: [id number] Arrival date & time: 04/17/20  1024      History   Chief Complaint Chief Complaint  Patient presents with  . Facial Swelling   HPI  60 year old female presents with the above complaints.  Patient reports that she had recent dental work done the week before last.  She states that she had some crowns placed.  She states that she was placed on antibiotic therapy.  She has completed the antibiotics.  She reports ongoing facial swelling.  Associated pain, 4/10 in severity.  No fever.  She is concerned given the recent dental work and lack of improvement.  Patient also reports that she feels like she has had some axillary lymphadenopathy.  Last mammogram was in November 2021.  No fever.  No other complaints.   Past Medical History:  Diagnosis Date  . Asthma    Bronchitis induced  . H/O seasonal allergies   . Heart palpitations   . Hypertension   . Sjogren's disease [facility])     Patient Active Problem List   Diagnosis Date Noted  . Palpitations 02/12/2018    Past Surgical History:  Procedure Laterality Date  . NO PAST SURGERIES      OB History   No obstetric history on file.      Home Medications    Prior to Admission medications   Medication Sig Start Date End Date Taking? Authorizing Provider  amoxicillin-clavulanate (AUGMENTIN) 875-125 MG tablet Take 1 tablet by mouth 2 (two) times daily. 04/17/20  Yes Carmellia Kreisler G, DO  fluticasone (FLONASE) 50 MCG/ACT nasal spray Place 2 sprays into both nostrils in the morning and at bedtime. 07/25/19  Yes Bailey Mech, NP  levocetirizine (XYZAL) 5 MG tablet Take 1 tablet (5 mg total) by mouth every evening. 07/25/19  Yes Bailey Mech, NP  losartan (COZAAR) 25 MG tablet Take 25 mg by mouth daily. 05/14/19  Yes [provider]  famotidine (PEPCID) 20 MG tablet Take 1 tablet (20 mg total) by mouth daily. 11/17/18 06/19/19  Phineas Semen, MD  sucralfate (CARAFATE) 1 g tablet Take 1  tablet (1 g total) by mouth 4 (four) times daily. 11/17/18 06/19/19  Phineas Semen, MD    Family History Family History  Problem Relation Age of Onset  . Other Mother        unknown medical history  . Heart failure Father   . Hypertension Father     Social History Social History   Tobacco Use  . Smoking status: Current Every Day Smoker    Packs/day: 1.00    Types: Cigarettes  . Smokeless tobacco: Never Used  Vaping Use  . Vaping Use: Never used  Substance Use Topics  . Alcohol use: No  . Drug use: No     Allergies   Atorvastatin, Biaxin [clarithromycin], Codeine, Levaquin [levofloxacin], Lisinopril, and Sulfa antibiotics   Review of Systems Review of Systems Per HPI  Physical Exam Triage Vital Signs ED Triage Vitals  Enc Vitals Group     BP 04/17/20 1139 138/79     Pulse Rate 04/17/20 1139 78     Resp 04/17/20 1139 18     Temp 04/17/20 1139 98.2 F (36.8 C)     Temp Source 04/17/20 1139 Oral     SpO2 04/17/20 1139 100 %     Weight 04/17/20 1137 229 lb 15 oz (104.3 kg)     Height 04/17/20 1137 5\' 7"  (1.702 m)     Head  Circumference --      Peak Flow --      Pain Score 04/17/20 1137 4     Pain Loc --      Pain Edu? --      Excl. in GC? --    No data found.  Updated Vital Signs BP 138/79 (BP Location: Right Arm)   Pulse 78   Temp 98.2 F (36.8 C) (Oral)   Resp 18   Ht 5\' 7"  (1.702 m)   Wt 104.3 kg   LMP 09/13/2016   SpO2 100%   BMI 36.01 kg/m   Visual Acuity Right Eye Distance:   Left Eye Distance:   Bilateral Distance:    Right Eye Near:   Left Eye Near:    Bilateral Near:     Physical Exam Vitals and nursing note reviewed. Exam conducted with a chaperone present.  Constitutional:      General: She is not in acute distress.    Appearance: Normal appearance. She is not ill-appearing.  HENT:     Head: Normocephalic and atraumatic.     Nose:     Comments: Patient with mild tenderness and slight swelling in the maxillary region. Eyes:      General:        Right eye: No discharge.        Left eye: No discharge.     Conjunctiva/sclera: Conjunctivae normal.  Cardiovascular:     Rate and Rhythm: Normal rate and regular rhythm.     Heart sounds: No murmur heard.   Pulmonary:     Effort: Pulmonary effort is normal.     Breath sounds: Normal breath sounds. No wheezing, rhonchi or rales.  Neurological:     Mental Status: She is alert.   Breast -no appreciable breast masses.  No lymphadenopathy.  No nipple discharge.   UC Treatments / Results  Labs (all labs ordered are listed, but only abnormal results are displayed) Labs Reviewed - No data to display  EKG   Radiology No results found.  Procedures Procedures (including critical care time)  Medications Ordered in UC Medications - No data to display  Initial Impression / Assessment and Plan / UC Course  I have reviewed the triage vital signs and the nursing notes.  Pertinent labs & imaging results that were available during my care of the patient were reviewed by me and considered in my medical decision making (see chart for details).    60 year old female presents with facial swelling after recent dental work.  Concerned that this may be dental in origin.  Placing on Augmentin.  Advised to follow-up with her dentist.  Final Clinical Impressions(s) / UC Diagnoses   Final diagnoses:  Facial swelling     Discharge Instructions     Medication as prescribed.  Call dentist for follow up.  Take care  Dr. 60    ED Prescriptions    Medication Sig Dispense Auth. Provider   amoxicillin-clavulanate (AUGMENTIN) 875-125 MG tablet Take 1 tablet by mouth 2 (two) times daily. 20 tablet Adriana Simas, DO     PDMP not reviewed this encounter.   Tommie Sams, Tommie Sams 04/17/20 1709

## 2020-04-17 NOTE — Discharge Instructions (Signed)
Medication as prescribed.  Call dentist for follow up.  Take care  Dr. Adriana Simas

## 2020-04-17 NOTE — ED Triage Notes (Signed)
Patient states she went to the dentist last week to have some dental work done. Patient states she started having facial swelling and nose swelling. She was on an antibiotic x 7 days. She states she has continued to have facial swelling.

## 2020-06-29 ENCOUNTER — Encounter: Payer: Self-pay | Admitting: Podiatry

## 2020-06-29 ENCOUNTER — Ambulatory Visit (INDEPENDENT_AMBULATORY_CARE_PROVIDER_SITE_OTHER): Payer: 59 | Admitting: Podiatry

## 2020-06-29 ENCOUNTER — Other Ambulatory Visit: Payer: Self-pay

## 2020-06-29 DIAGNOSIS — M7662 Achilles tendinitis, left leg: Secondary | ICD-10-CM

## 2020-06-29 DIAGNOSIS — M216X2 Other acquired deformities of left foot: Secondary | ICD-10-CM | POA: Diagnosis not present

## 2020-06-29 DIAGNOSIS — M21862 Other specified acquired deformities of left lower leg: Secondary | ICD-10-CM

## 2020-07-04 ENCOUNTER — Encounter: Payer: Self-pay | Admitting: Podiatry

## 2020-07-04 NOTE — Progress Notes (Signed)
Subjective:  Patient ID: Karen Reed, female    DOB: 06-Oct-1960,  MRN: [id number]  Chief Complaint  Patient presents with   Foot Pain    "It's the same problem as before, the achilles.  I have been trying to shift my weight to the other side and It's causing problems on my right side."    60 y.o. female presents with the above complaint.  Patient presents with complaint of left Achilles tendinitis with posterior spurring.  Patient states is painful to touch has progressive gotten worse.  She was last seen by Dr. Logan Bores in February and they did give him some relief.  However she states is it continues to hurt.  She has not been able to work because of it.  She would like to discuss treatment options.  She states is now starting her on the other side because she is shifting more weight.  She constantly works on her foot.  She works as a Water engineer.  She denies any alcohol.  She does smoke half a pack a day.   Review of Systems: Negative except as noted in the HPI. Denies N/V/F/Ch.  Past Medical History:  Diagnosis Date   Asthma    Bronchitis induced   H/O seasonal allergies    Heart palpitations    Hypertension    Sjogren's disease (HCC)     Current Outpatient Medications:    fluticasone (FLONASE) 50 MCG/ACT nasal spray, Place 2 sprays into both nostrils in the morning and at bedtime., Disp: 18.2 mL, Rfl: 2   losartan (COZAAR) 25 MG tablet, Take 25 mg by mouth daily., Disp: , Rfl:    amoxicillin-clavulanate (AUGMENTIN) 875-125 MG tablet, Take 1 tablet by mouth 2 (two) times daily. (Patient not taking: Reported on 06/29/2020), Disp: 20 tablet, Rfl: 0   levocetirizine (XYZAL) 5 MG tablet, Take 1 tablet (5 mg total) by mouth every evening. (Patient not taking: Reported on 06/29/2020), Disp: 30 tablet, Rfl: 1  Social History   Tobacco Use  Smoking Status Every Day   Packs/day: 0.50   Pack years: 0.00   Types: Cigarettes  Smokeless Tobacco Never    Allergies  Allergen  Reactions   Atorvastatin Other (See Comments)    Reaction: Joint pain. Patient states she can tolerate lower doses   Biaxin [Clarithromycin] Other (See Comments)    Reaction: unknown   Codeine Hives   Levaquin [Levofloxacin] Other (See Comments)    Joint Pain   Lisinopril     Cough    Sulfa Antibiotics Hives and Swelling   Objective:  There were no vitals filed for this visit. There is no height or weight on file to calculate BMI. Constitutional Well developed. Well nourished.  Vascular Dorsalis pedis pulses palpable bilaterally. Posterior tibial pulses palpable bilaterally. Capillary refill normal to all digits.  No cyanosis or clubbing noted. Pedal hair growth normal.  Neurologic Normal speech. Oriented to person, place, and time. Epicritic sensation to light touch grossly present bilaterally.  Dermatologic Nails well groomed and normal in appearance. No open wounds. No skin lesions.  Orthopedic: Pain on palpation to the left Achilles tendon insertion.  Posterior heel spurring noted.  Underlying Haglund's deformity could be clinically appreciated.  Pain with dorsiflexion of the ankle joint no pain with plantarflexion of the ankle joint no pain at the peroneal tendon, posterior tibial tendon, ATFL ligament.  No deep intra-articular ankle pain noted.  Positive Silfverskiold test   Radiographs: None Assessment:   1. Achilles tendinitis, left  leg   2. Gastrocnemius equinus, left    Plan:  Patient was evaluated and treated and all questions answered.  Left Achilles tendinitis with posterior heel spurring with underlying Haglund's deformity and positive gastrocnemius equinus. -I explained the patient the etiology of Achilles tendinitis and various treatment options were discussed.  Given that she has received injections in the past and mostly has failed all conservative treatment options including cam boot immobilization and injection I believe patient will benefit from an MRI  evaluation.  If there is a significant deformity patient may need surgical intervention she states understanding would like to proceed with that. -She will be scheduled an MRI of the left ankle to rule out Achilles tendon tear  No follow-ups on file.

## 2020-07-19 ENCOUNTER — Other Ambulatory Visit: Payer: Self-pay

## 2020-07-19 ENCOUNTER — Ambulatory Visit
Admission: RE | Admit: 2020-07-19 | Discharge: 2020-07-19 | Disposition: A | Discharge status: Home / Self Care | Payer: 59 | Source: Ambulatory Visit | Attending: Podiatry | Admitting: Podiatry

## 2020-07-19 DIAGNOSIS — M7662 Achilles tendinitis, left leg: Secondary | ICD-10-CM

## 2020-07-19 DIAGNOSIS — M21862 Other specified acquired deformities of left lower leg: Secondary | ICD-10-CM

## 2020-07-20 ENCOUNTER — Ambulatory Visit (INDEPENDENT_AMBULATORY_CARE_PROVIDER_SITE_OTHER): Payer: 59 | Admitting: Podiatry

## 2020-07-20 ENCOUNTER — Encounter: Payer: Self-pay | Admitting: Podiatry

## 2020-07-20 DIAGNOSIS — M216X2 Other acquired deformities of left foot: Secondary | ICD-10-CM | POA: Diagnosis not present

## 2020-07-20 DIAGNOSIS — M7662 Achilles tendinitis, left leg: Secondary | ICD-10-CM

## 2020-07-20 DIAGNOSIS — M21862 Other specified acquired deformities of left lower leg: Secondary | ICD-10-CM

## 2020-07-20 DIAGNOSIS — M9262 Juvenile osteochondrosis of tarsus, left ankle: Secondary | ICD-10-CM | POA: Diagnosis not present

## 2020-07-20 DIAGNOSIS — Z01818 Encounter for other preprocedural examination: Secondary | ICD-10-CM | POA: Diagnosis not present

## 2020-07-20 NOTE — Progress Notes (Signed)
Subjective:  Patient ID: Karen Reed, female    DOB: 14-Nov-1960,  MRN: [id number]  Chief Complaint  Patient presents with   Foot Pain    PT stated that she is still having pain     60 y.o. female presents with the above complaint.  Patient presents with a follow-up of left Achilles tendinitis with Haglund's deformity and insertional tendinitis.  Patient states that this does continue her the cam boot does help a little bit.  She would like to discuss treatment options for this.  She has not seen anyone else prior to seeing me.  She would like to discuss surgical options at this time.   Review of Systems: Negative except as noted in the HPI. Denies N/V/F/Ch.  Past Medical History:  Diagnosis Date   Asthma    Bronchitis induced   H/O seasonal allergies    Heart palpitations    Hypertension    Sjogren's disease (HCC)     Current Outpatient Medications:    amoxicillin-clavulanate (AUGMENTIN) 875-125 MG tablet, Take 1 tablet by mouth 2 (two) times daily. (Patient not taking: Reported on 06/29/2020), Disp: 20 tablet, Rfl: 0   fluticasone (FLONASE) 50 MCG/ACT nasal spray, Place 2 sprays into both nostrils in the morning and at bedtime., Disp: 18.2 mL, Rfl: 2   levocetirizine (XYZAL) 5 MG tablet, Take 1 tablet (5 mg total) by mouth every evening. (Patient not taking: Reported on 06/29/2020), Disp: 30 tablet, Rfl: 1   losartan (COZAAR) 25 MG tablet, Take 25 mg by mouth daily., Disp: , Rfl:   Social History   Tobacco Use  Smoking Status Every Day   Packs/day: 0.50   Pack years: 0.00   Types: Cigarettes  Smokeless Tobacco Never    Allergies  Allergen Reactions   Atorvastatin Other (See Comments)    Reaction: Joint pain. Patient states she can tolerate lower doses   Biaxin [Clarithromycin] Other (See Comments)    Reaction: unknown   Codeine Hives   Levaquin [Levofloxacin] Other (See Comments)    Joint Pain   Lisinopril     Cough    Sulfa Antibiotics Hives and Swelling    Objective:  There were no vitals filed for this visit. There is no height or weight on file to calculate BMI. Constitutional Well developed. Well nourished.  Vascular Dorsalis pedis pulses palpable bilaterally. Posterior tibial pulses palpable bilaterally. Capillary refill normal to all digits.  No cyanosis or clubbing noted. Pedal hair growth normal.  Neurologic Normal speech. Oriented to person, place, and time. Epicritic sensation to light touch grossly present bilaterally.  Dermatologic Nails well groomed and normal in appearance. No open wounds. No skin lesions.  Orthopedic: Pain on palpation to the left Achilles tendon insertion.  Posterior heel spurring noted.  Underlying Haglund's deformity could be clinically appreciated.  Pain with dorsiflexion of the ankle joint no pain with plantarflexion of the ankle joint no pain at the peroneal tendon, posterior tibial tendon, ATFL ligament.  No deep intra-articular ankle pain noted.  Positive Silfverskiold test   Radiographs: None Assessment:   1. Achilles tendinitis, left leg   2. Gastrocnemius equinus, left   3. Haglund's deformity, left   4. Preoperative examination     Plan:  Patient was evaluated and treated and all questions answered.  Left Achilles tendinitis with posterior heel spurring with underlying Haglund's deformity and positive gastrocnemius equinus. -I explained the patient the etiology of Achilles tendinitis and various treatment options were discussed.   -MRI was reviewed with  the patient in extensive detail and it appears that patient has interstitial tearing of the Achilles tendon with underlying retrocalcaneal bursitis/Haglund's deformity. -Clinically she has failed all conservative treatment options including injection and cam boot immobilization at this time I believe patient will benefit from surgical options.  I discussed with the patient my preoperative intraoperative and postoperative plan in extensive  detail I plan on performing detachment and reattachment of the Achilles tendon with primary repair of the Achilles tendon with resection of Haglund's deformity and gastrocnemius recession.  I discussed my surgical plans in extensive detail she will be nonweightbearing to the left lower extremity with knee scooter.  I encouraged her to obtain a knee scooter she states that she will's. -Informed surgical risk consent was reviewed and read aloud to the patient.  I reviewed the films.  I have discussed my findings with the patient in great detail.  I have discussed all risks including but not limited to infection, stiffness, scarring, limp, disability, deformity, damage to blood vessels and nerves, numbness, poor healing, need for braces, arthritis, chronic pain, amputation, death.  All benefits and realistic expectations discussed in great detail.  I have made no promises as to the outcome.  I have provided realistic expectations.  I have offered the patient a 2nd opinion, which they have declined and assured me they preferred to proceed despite the risks  No follow-ups on file.

## 2020-08-03 DIAGNOSIS — S86012A Strain of left Achilles tendon, initial encounter: Secondary | ICD-10-CM | POA: Insufficient documentation

## 2020-08-03 DIAGNOSIS — F419 Anxiety disorder, unspecified: Secondary | ICD-10-CM | POA: Insufficient documentation

## 2020-08-03 DIAGNOSIS — G4733 Obstructive sleep apnea (adult) (pediatric): Secondary | ICD-10-CM | POA: Insufficient documentation

## 2020-08-07 ENCOUNTER — Telehealth: Payer: Self-pay | Admitting: Urology

## 2020-08-07 NOTE — Telephone Encounter (Signed)
Pt called stating she needed to push her sx date back a couple weeks due to needing to work to have the time to be able to take the 3 weeks off work after [employer]. Her sx has been rescheduled from 08/28/20 to 09/25/20. I have informed Dr. Allena Katz and called Aram Beecham with [facility] with the change.

## 2020-08-16 ENCOUNTER — Telehealth: Payer: Self-pay | Admitting: Podiatry

## 2020-08-16 NOTE — Telephone Encounter (Signed)
Patient came into office today stating she would like a note to stay out of work untiul after her surgery. Pt states her surgery is Sept 19th. Pts tele number is [phone].

## 2020-08-17 ENCOUNTER — Encounter: Payer: Self-pay | Admitting: Podiatry

## 2020-08-29 ENCOUNTER — Telehealth: Payer: Self-pay | Admitting: Urology

## 2020-08-29 NOTE — Telephone Encounter (Signed)
DOS - 09/25/20  REPAIR ACHILLES TENDON LEFT --- [id number] GASTROCNEMIUS RECESS LEFT --- [id number]   [facility] EFFECTIVE DATE - 01/15/20  PLAN DEDUCTIBLE -  $203.00 W/ $0.00 REMAINING OUT OF POCKET - Member's individual out-of-pocket maximum has no limit. COINSURANCE - 20% COPAY - $0.00   PER [employer] [facility] SITE FOR CPT CODES 95188 AND [phone] Notification or Prior Authorization is not required for the requested services  Decision ID #:[id number]

## 2020-09-05 ENCOUNTER — Encounter: Payer: 59 | Admitting: Podiatry

## 2020-09-19 ENCOUNTER — Ambulatory Visit
Admission: EM | Admit: 2020-09-19 | Discharge: 2020-09-19 | Disposition: A | Discharge status: Home / Self Care | Payer: 59 | Attending: Emergency Medicine | Admitting: Emergency Medicine

## 2020-09-19 ENCOUNTER — Other Ambulatory Visit: Payer: Self-pay

## 2020-09-19 ENCOUNTER — Encounter: Payer: 59 | Admitting: Podiatry

## 2020-09-19 ENCOUNTER — Encounter: Payer: Self-pay | Admitting: Emergency Medicine

## 2020-09-19 DIAGNOSIS — J069 Acute upper respiratory infection, unspecified: Secondary | ICD-10-CM | POA: Insufficient documentation

## 2020-09-19 DIAGNOSIS — Z20822 Contact with and (suspected) exposure to covid-19: Secondary | ICD-10-CM | POA: Insufficient documentation

## 2020-09-19 LAB — SARS CORONAVIRUS 2 (TAT 6-24 HRS): SARS Coronavirus 2: NEGATIVE

## 2020-09-19 MED ORDER — PROMETHAZINE-DM 6.25-15 MG/5ML PO SYRP: 5.0000 mL | ORAL_SOLUTION | Freq: Four times a day (QID) | ORAL | 0 refills | Status: DC | PRN

## 2020-09-19 MED ORDER — IPRATROPIUM BROMIDE 0.06 % NA SOLN: 2.0000 | Freq: Four times a day (QID) | NASAL | 12 refills | Status: DC

## 2020-09-19 MED ORDER — BENZONATATE 100 MG PO CAPS: 200.0000 mg | ORAL_CAPSULE | Freq: Three times a day (TID) | ORAL | 0 refills | Status: DC

## 2020-09-19 NOTE — Discharge Instructions (Addendum)
Use the Atrovent nasal spray, 2 squirts in each nostril every 6 hours, as needed for runny nose and postnasal drip.  Use the Tessalon Perles every 8 hours during the day.  Take them with a small sip of water.  They may give you some numbness to the base of your tongue or a metallic taste in your mouth, this is normal.  Use the Promethazine DM cough syrup at bedtime for cough and congestion.  It will make you drowsy so do not take it during the day.  Perform sinus irrigation 2-3 times a day with a Neilmed sinus rinse kit and distilled water. Do not use tap water.   Return for reevaluation or see your primary care provider for any new or worsening symptoms.

## 2020-09-19 NOTE — ED Triage Notes (Signed)
Pt presents today with sinus pressure with sinus drainage causing her to cough at night x 3 days. Denies fever.   She has taken two home Covid tests, both neg (last one this morning).

## 2020-09-19 NOTE — ED Provider Notes (Signed)
[facility]    CSN: [id number] Arrival date & time: 09/19/20  1107      History   Chief Complaint Chief Complaint  Patient presents with   Facial Pain   Cough    HPI Karen Reed is a 60 y.o. female.   HPI  60 year old female here for evaluation of upper respiratory complaints.  Patient reports that for the last 3 days she has been experiencing sinus pressure and postnasal drip that triggers a cough.  The cough is worse when she lays down.  She is getting some nasal discharge out that is yellow in color.  Her cough is nonproductive.  She states that she did also have some intermittent wheezing that comes on at nighttime when she starts coughing.  She also endorses some mild ear pressure.  She denies fever, sore throat, shortness of breath, or GI complaints.  Taken to COVID test at home that are both been negative.  Her last test that she took was this morning.  Past Medical History:  Diagnosis Date   Asthma    Bronchitis induced   H/O seasonal allergies    Heart palpitations    Hypertension    Sjogren's disease [facility])     Patient Active Problem List   Diagnosis Date Noted   Palpitations 02/12/2018    Past Surgical History:  Procedure Laterality Date   NO PAST SURGERIES      OB History   No obstetric history on file.      Home Medications    Prior to Admission medications   Medication Sig Start Date End Date Taking? Authorizing Provider  benzonatate (TESSALON) 100 MG capsule Take 2 capsules (200 mg total) by mouth every 8 (eight) hours. 09/19/20  Yes Margarette Canada, NP  ipratropium (ATROVENT) 0.06 % nasal spray Place 2 sprays into both nostrils 4 (four) times daily. 09/19/20  Yes Margarette Canada, NP  promethazine-dextromethorphan (PROMETHAZINE-DM) 6.25-15 MG/5ML syrup Take 5 mLs by mouth 4 (four) times daily as needed. 09/19/20  Yes Margarette Canada, NP  fluticasone [facility]) 50 MCG/ACT nasal spray Place 2 sprays into both nostrils in the morning and at bedtime.  07/25/19   Gertie Baron, NP  levocetirizine (XYZAL) 5 MG tablet Take 1 tablet (5 mg total) by mouth every evening. Patient not taking: Reported on 06/29/2020 07/25/19   Gertie Baron, NP  losartan (COZAAR) 25 MG tablet Take 25 mg by mouth daily. 05/14/19   [provider]  famotidine (PEPCID) 20 MG tablet Take 1 tablet (20 mg total) by mouth daily. 11/17/18 06/19/19  Nance Pear, MD  sucralfate (CARAFATE) 1 g tablet Take 1 tablet (1 g total) by mouth 4 (four) times daily. 11/17/18 06/19/19  Nance Pear, MD    Family History Family History  Problem Relation Age of Onset   Other Mother        unknown medical history   Heart failure Father    Hypertension Father     Social History Social History   Tobacco Use   Smoking status: Every Day    Packs/day: 0.50    Types: Cigarettes   Smokeless tobacco: Never  Vaping Use   Vaping Use: Never used  Substance Use Topics   Alcohol use: No   Drug use: No     Allergies   Atorvastatin, Biaxin [clarithromycin], Codeine, Levaquin [levofloxacin], Lisinopril, and Sulfa antibiotics   Review of Systems Review of Systems  Constitutional:  Negative for activity change, appetite change and fever.  HENT:  Positive  for congestion, ear pain, postnasal drip and rhinorrhea. Negative for sore throat.   Respiratory:  Positive for cough and wheezing. Negative for shortness of breath.   Gastrointestinal:  Negative for abdominal pain, diarrhea, nausea and vomiting.  Skin:  Negative for rash.  Hematological: Negative.   Psychiatric/Behavioral: Negative.      Physical Exam Triage Vital Signs ED Triage Vitals  Enc Vitals Group     BP 09/19/20 1239 134/85     Pulse Rate 09/19/20 1239 90     Resp 09/19/20 1239 18     Temp 09/19/20 1239 98.8 F (37.1 C)     Temp Source 09/19/20 1239 Oral     SpO2 09/19/20 1239 97 %     Weight --      Height --      Head Circumference --      Peak Flow --      Pain Score 09/19/20 1237 0     Pain  Loc --      Pain Edu? --      Excl. in [place]? --    No data found.  Updated Vital Signs BP 134/85 (BP Location: Right Arm)   Pulse 90   Temp 98.8 F (37.1 C) (Oral)   Resp 18   SpO2 97%   Visual Acuity Right Eye Distance:   Left Eye Distance:   Bilateral Distance:    Right Eye Near:   Left Eye Near:    Bilateral Near:     Physical Exam Vitals and nursing note reviewed.  Constitutional:      General: She is not in acute distress.    Appearance: Normal appearance. She is not ill-appearing.  HENT:     Head: Normocephalic and atraumatic.     Right Ear: Tympanic membrane, ear canal and external ear normal. There is no impacted cerumen.     Left Ear: Tympanic membrane, ear canal and external ear normal. There is no impacted cerumen.     Nose: Congestion and rhinorrhea present.     Mouth/Throat:     Mouth: Mucous membranes are moist.     Pharynx: Oropharynx is clear. Posterior oropharyngeal erythema present.  Eyes:     Extraocular Movements: Extraocular movements intact.     Pupils: Pupils are equal, round, and reactive to light.  Cardiovascular:     Rate and Rhythm: Normal rate and regular rhythm.     Pulses: Normal pulses.     Heart sounds: Normal heart sounds. No murmur heard.   No gallop.  Pulmonary:     Effort: Pulmonary effort is normal.     Breath sounds: Normal breath sounds. No wheezing, rhonchi or rales.  Musculoskeletal:     Cervical back: Normal range of motion and neck supple.  Lymphadenopathy:     Cervical: No cervical adenopathy.  Skin:    General: Skin is warm and dry.     Capillary Refill: Capillary refill takes less than 2 seconds.     Findings: No erythema or rash.  Neurological:     General: No focal deficit present.     Mental Status: She is alert and oriented to person, place, and time.  Psychiatric:        Mood and Affect: Mood normal.        Behavior: Behavior normal.        Thought Content: Thought content normal.        Judgment: Judgment  normal.     UC Treatments / Results  Labs (  all labs ordered are listed, but only abnormal results are displayed) Labs Reviewed  SARS CORONAVIRUS 2 (TAT 6-24 HRS)    EKG   Radiology No results found.  Procedures Procedures (including critical care time)  Medications Ordered in UC Medications - No data to display  Initial Impression / Assessment and Plan / UC Course  I have reviewed the triage vital signs and the nursing notes.  Pertinent labs & imaging results that were available during my care of the patient were reviewed by me and considered in my medical decision making (see chart for details).  Is a very pleasant, nontoxic-appearing 60 year old female here for evaluation of upper respiratory complaints and cough as outlined in the HPI above.  Patient's physical exam reveals pearly gray tympanic membranes bilaterally with normal light reflex and clear external auditory canals.  Nasal mucosa is erythematous and edematous with clear nasal discharge.  Patient does have mild tenderness to percussion of frontal and maxillary sinuses bilaterally.  Oropharyngeal exam reveals very mild posterior oropharyngeal erythema and clear postnasal drip.  No cervical lymphadenopathy appreciated exam.  Cardiopulmonary exam reveals clear lung sounds in all fields.  Patient exam is consistent with viral URI with cough.  We will send COVID PCR just for confirmation despite the 2 negative home test.  Patient vies to isolate pending results of that test.  We will give Atrovent nasal spray to help with nasal congestion and postnasal drip and Tessalon Perles and Promethazine DM cough syrup to help with cough and congestion.  Work note provided.   Final Clinical Impressions(s) / UC Diagnoses   Final diagnoses:  Viral URI with cough     Discharge Instructions      Use the Atrovent nasal spray, 2 squirts in each nostril every 6 hours, as needed for runny nose and postnasal drip.  Use the Tessalon  Perles every 8 hours during the day.  Take them with a small sip of water.  They may give you some numbness to the base of your tongue or a metallic taste in your mouth, this is normal.  Use the Promethazine DM cough syrup at bedtime for cough and congestion.  It will make you drowsy so do not take it during the day.  Perform sinus irrigation 2-3 times a day with a Neilmed sinus rinse kit and distilled water. Do not use tap water.   Return for reevaluation or see your primary care provider for any new or worsening symptoms.      ED Prescriptions     Medication Sig Dispense Auth. Provider   ipratropium (ATROVENT) 0.06 % nasal spray Place 2 sprays into both nostrils 4 (four) times daily. 15 mL Margarette Canada, NP   benzonatate (TESSALON) 100 MG capsule Take 2 capsules (200 mg total) by mouth every 8 (eight) hours. 21 capsule Margarette Canada, NP   promethazine-dextromethorphan (PROMETHAZINE-DM) 6.25-15 MG/5ML syrup Take 5 mLs by mouth 4 (four) times daily as needed. 118 mL Margarette Canada, NP      PDMP not reviewed this encounter.   Margarette Canada, NP 09/19/20 1258

## 2020-09-21 ENCOUNTER — Ambulatory Visit
Admission: EM | Admit: 2020-09-21 | Discharge: 2020-09-21 | Disposition: A | Discharge status: Home / Self Care | Payer: 59 | Attending: Student | Admitting: Student

## 2020-09-21 ENCOUNTER — Telehealth: Payer: Self-pay

## 2020-09-21 ENCOUNTER — Other Ambulatory Visit: Payer: Self-pay

## 2020-09-21 DIAGNOSIS — J0110 Acute frontal sinusitis, unspecified: Secondary | ICD-10-CM

## 2020-09-21 MED ORDER — AMOXICILLIN-POT CLAVULANATE 875-125 MG PO TABS: 1.0000 | ORAL_TABLET | Freq: Two times a day (BID) | ORAL | 0 refills | Status: DC

## 2020-09-21 NOTE — Telephone Encounter (Signed)
Karen Reed called to cancel her surgery with Dr. Allena Katz on 09/25/2020. She stated she is not feeling well and will call me back to reschedule. Notified Dr. Allena Katz and Aram Beecham with [facility]

## 2020-09-21 NOTE — ED Provider Notes (Signed)
[facility]    CSN: [id number] Arrival date & time: 09/21/20  1628      History   Chief Complaint Chief Complaint  Patient presents with   Nasal Congestion    HPI Karen Reed is a 60 y.o. female who presents today for repeat evaluation of ongoing nasal congestion, facial pressure, cough and chest tightness.  The patient was evaluated at urgent care on 09/19/2020, at this appointment the patient had been experiencing 3 days of sinus pressure, postnasal drip that triggers a cough.  The patient has taken a COVID test which was negative.  The patient was prescribed Atrovent, Tessalon pearles and Promethazine-DM.  She does state that these medications have helped with some of her symptoms however she is still experiencing moderate sinus pressure.  She was concerned due to the brownish appearance of her nasal discharge.  The patient does report some underlying rib pain from recent cough but feels that this is improved with ibuprofen.  She denies any pain that radiates from her chest into her back.  She denies any shortness of breath lower back pain or urinary symptoms.  The patient denies any sick contacts since she was last evaluated.  HPI  Past Medical History:  Diagnosis Date   Asthma    Bronchitis induced   H/O seasonal allergies    Heart palpitations    Hypertension    Sjogren's disease [facility])     Patient Active Problem List   Diagnosis Date Noted   Palpitations 02/12/2018    Past Surgical History:  Procedure Laterality Date   NO PAST SURGERIES      OB History   No obstetric history on file.      Home Medications    Prior to Admission medications   Medication Sig Start Date End Date Taking? Authorizing Provider  amoxicillin-clavulanate (AUGMENTIN) 875-125 MG tablet Take 1 tablet by mouth every 12 (twelve) hours. 09/21/20  Yes Anson Oregon, PA-C  benzonatate (TESSALON) 100 MG capsule Take 2 capsules (200 mg total) by mouth every 8 (eight) hours.  09/19/20  Yes Becky Augusta, NP  ipratropium (ATROVENT) 0.06 % nasal spray Place 2 sprays into both nostrils 4 (four) times daily. 09/19/20  Yes Becky Augusta, NP  losartan (COZAAR) 25 MG tablet Take 25 mg by mouth daily. 05/14/19  Yes [provider]  promethazine-dextromethorphan (PROMETHAZINE-DM) 6.25-15 MG/5ML syrup Take 5 mLs by mouth 4 (four) times daily as needed. 09/19/20  Yes Becky Augusta, NP  famotidine (PEPCID) 20 MG tablet Take 1 tablet (20 mg total) by mouth daily. 11/17/18 06/19/19  Phineas Semen, MD  sucralfate (CARAFATE) 1 g tablet Take 1 tablet (1 g total) by mouth 4 (four) times daily. 11/17/18 06/19/19  Phineas Semen, MD    Family History Family History  Problem Relation Age of Onset   Other Mother        unknown medical history   Heart failure Father    Hypertension Father     Social History Social History   Tobacco Use   Smoking status: Every Day    Packs/day: 0.50    Types: Cigarettes   Smokeless tobacco: Never  Vaping Use   Vaping Use: Never used  Substance Use Topics   Alcohol use: No   Drug use: No     Allergies   Atorvastatin, Biaxin [clarithromycin], Codeine, Levaquin [levofloxacin], Lisinopril, and Sulfa antibiotics   Review of Systems Review of Systems  Constitutional:  Negative for fever.  HENT:  Positive for congestion,  postnasal drip and sinus pressure.   Eyes: Negative.   Respiratory:  Positive for cough. Negative for shortness of breath and wheezing.   Cardiovascular:  Negative for chest pain.  Gastrointestinal: Negative.   Endocrine: Negative.   Genitourinary: Negative.   Musculoskeletal: Negative.   Skin: Negative.   Allergic/Immunologic: Negative.   Neurological: Negative.   Hematological: Negative.   Psychiatric/Behavioral: Negative.      Physical Exam Triage Vital Signs ED Triage Vitals  Enc Vitals Group     BP 09/21/20 1720 124/81     Pulse Rate 09/21/20 1720 76     Resp 09/21/20 1720 18     Temp 09/21/20 1720 98.5  F (36.9 C)     Temp Source 09/21/20 1720 Oral     SpO2 09/21/20 1720 99 %     Weight 09/21/20 1717 230 lb (104.3 kg)     Height 09/21/20 1717 5\' 7"  (1.702 m)     Head Circumference --      Peak Flow --      Pain Score 09/21/20 1715 3     Pain Loc --      Pain Edu? --      Excl. in [place]? --    No data found.  Updated Vital Signs BP 124/81 (BP Location: Left Arm)   Pulse 76   Temp 98.5 F (36.9 C) (Oral)   Resp 18   Ht 5\' 7"  (1.702 m)   Wt 230 lb (104.3 kg)   SpO2 99%   BMI 36.02 kg/m   Visual Acuity Right Eye Distance:   Left Eye Distance:   Bilateral Distance:    Right Eye Near:   Left Eye Near:    Bilateral Near:     Physical Exam Constitutional:      Appearance: Normal appearance. She is not ill-appearing or toxic-appearing.  HENT:     Right Ear: Tympanic membrane and ear canal normal.     Left Ear: Tympanic membrane and ear canal normal.     Nose: Congestion and rhinorrhea present.     Comments: Moderate erythema to bilateral nasal canals.    Mouth/Throat:     Mouth: Mucous membranes are moist.     Pharynx: Oropharynx is clear. No oropharyngeal exudate or posterior oropharyngeal erythema.  Eyes:     Extraocular Movements: Extraocular movements intact.     Conjunctiva/sclera: Conjunctivae normal.  Cardiovascular:     Rate and Rhythm: Normal rate and regular rhythm.     Heart sounds: No murmur heard.   No friction rub. No gallop.  Pulmonary:     Effort: Pulmonary effort is normal.     Breath sounds: Normal breath sounds. No wheezing, rhonchi or rales.  Abdominal:     General: Bowel sounds are normal.  Musculoskeletal:     Cervical back: Normal range of motion.  Lymphadenopathy:     Cervical: No cervical adenopathy.  Neurological:     Mental Status: She is alert.   UC Treatments / Results  Labs (all labs ordered are listed, but only abnormal results are displayed) Labs Reviewed - No data to display  EKG   Radiology No results  found.  Procedures Procedures (including critical care time)  Medications Ordered in UC Medications - No data to display  Initial Impression / Assessment and Plan / UC Course  I have reviewed the triage vital signs and the nursing notes.  Pertinent labs & imaging results that were available during my care of the patient were reviewed  by me and considered in my medical decision making (see chart for details).     1.  Treatment options were discussed today with the patient. 2.  Overall the patient feels that she is feeling better than she was 2 days ago. 3.  I instructed the patient to continue to take the medication as previous prescribed. 4.  The patient was given an antibiotic to begin taking over the weekend if she does not notice any further relief of her symptoms. 5.  The patient will follow-up on as-needed basis at this time. Final Clinical Impressions(s) / UC Diagnoses   Final diagnoses:  Acute non-recurrent frontal sinusitis     Discharge Instructions      -Continue with medications that were prescribed at your most recent visit. -Continue with nasal spray. -If no improvement of symptoms over the weekend, can get the antibiotic prescription filled and begin using. -Follow-up if no improvement of symptoms.     ED Prescriptions     Medication Sig Dispense Auth. Provider   amoxicillin-clavulanate (AUGMENTIN) 875-125 MG tablet Take 1 tablet by mouth every 12 (twelve) hours. 20 tablet Anson Oregon, PA-C      PDMP not reviewed this encounter.   Anson Oregon, [place] 09/21/20 [phone]

## 2020-09-21 NOTE — ED Triage Notes (Signed)
Patient was seen on 09/19/2020 states her mucus is bloody and brownish, also reports her both sides is hurting near rib cage area.Tuesday she took a COVID test and it came out negative.-MB

## 2020-09-21 NOTE — Discharge Instructions (Signed)
-  Continue with medications that were prescribed at your most recent visit. -Continue with nasal spray. -If no improvement of symptoms over the weekend, can get the antibiotic prescription filled and begin using. -Follow-up if no improvement of symptoms.

## 2020-10-03 ENCOUNTER — Encounter: Payer: 59 | Admitting: Podiatry

## 2020-10-04 ENCOUNTER — Encounter: Payer: 59 | Admitting: Podiatry

## 2020-10-17 ENCOUNTER — Encounter: Payer: 59 | Admitting: Podiatry

## 2020-10-18 ENCOUNTER — Encounter: Payer: 59 | Admitting: Podiatry

## 2020-11-30 ENCOUNTER — Encounter: Payer: Self-pay | Admitting: Emergency Medicine

## 2020-11-30 ENCOUNTER — Other Ambulatory Visit: Payer: Self-pay

## 2020-11-30 ENCOUNTER — Ambulatory Visit
Admission: EM | Admit: 2020-11-30 | Discharge: 2020-11-30 | Disposition: A | Discharge status: Home / Self Care | Payer: 59 | Attending: Emergency Medicine | Admitting: Emergency Medicine

## 2020-11-30 DIAGNOSIS — J0101 Acute recurrent maxillary sinusitis: Secondary | ICD-10-CM | POA: Insufficient documentation

## 2020-11-30 DIAGNOSIS — Z20822 Contact with and (suspected) exposure to covid-19: Secondary | ICD-10-CM | POA: Insufficient documentation

## 2020-11-30 LAB — RAPID INFLUENZA A&B ANTIGENS
Influenza A (ARMC): NEGATIVE
Influenza B (ARMC): NEGATIVE

## 2020-11-30 MED ORDER — DOXYCYCLINE HYCLATE 100 MG PO CAPS: 100.0000 mg | ORAL_CAPSULE | Freq: Two times a day (BID) | ORAL | 0 refills | Status: DC

## 2020-11-30 NOTE — ED Provider Notes (Signed)
[facility]    CSN: [id number] Arrival date & time: 11/30/20  0818      History   Chief Complaint Chief Complaint  Patient presents with   Fever   Headache   Facial Pain    HPI Karen Reed is a 60 y.o. female.   Patient presents with fever, chills, body aches, sinus pain, nasal congestion, rhinorrhea, nonproductive cough, intermittent wheezing and intermittent generalized headaches.  Poor appetite but tolerating liquids.  History of asthma, hypertension, diabetes sinus sinusitis.  Has attempted use of over-the-counter pain management which has been somewhat helpful.  Denies shortness of breath, chest pain or tightness, abdominal pain, nausea, vomiting, diarrhea, ear pain, sore throat.  Past Medical History:  Diagnosis Date   Asthma    Bronchitis induced   H/O seasonal allergies    Heart palpitations    Hypertension    Sjogren's disease [facility])     Patient Active Problem List   Diagnosis Date Noted   Palpitations 02/12/2018    Past Surgical History:  Procedure Laterality Date   NO PAST SURGERIES      OB History   No obstetric history on file.      Home Medications    Prior to Admission medications   Medication Sig Start Date End Date Taking? Authorizing Provider  amoxicillin-clavulanate (AUGMENTIN) 875-125 MG tablet Take 1 tablet by mouth every 12 (twelve) hours. 09/21/20   Anson Oregon, PA-C  benzonatate (TESSALON) 100 MG capsule Take 2 capsules (200 mg total) by mouth every 8 (eight) hours. 09/19/20   Becky Augusta, NP  ipratropium (ATROVENT) 0.06 % nasal spray Place 2 sprays into both nostrils 4 (four) times daily. 09/19/20   Becky Augusta, NP  losartan (COZAAR) 25 MG tablet Take 25 mg by mouth daily. 05/14/19   [provider]  promethazine-dextromethorphan (PROMETHAZINE-DM) 6.25-15 MG/5ML syrup Take 5 mLs by mouth 4 (four) times daily as needed. 09/19/20   Becky Augusta, NP  famotidine (PEPCID) 20 MG tablet Take 1 tablet (20 mg total) by  mouth daily. 11/17/18 06/19/19  Phineas Semen, MD  sucralfate (CARAFATE) 1 g tablet Take 1 tablet (1 g total) by mouth 4 (four) times daily. 11/17/18 06/19/19  Phineas Semen, MD    Family History Family History  Problem Relation Age of Onset   Other Mother        unknown medical history   Heart failure Father    Hypertension Father     Social History Social History   Tobacco Use   Smoking status: Every Day    Packs/day: 0.50    Types: Cigarettes   Smokeless tobacco: Never  Vaping Use   Vaping Use: Never used  Substance Use Topics   Alcohol use: No   Drug use: No     Allergies   Atorvastatin, Biaxin [clarithromycin], Codeine, Levaquin [levofloxacin], Lisinopril, and Sulfa antibiotics   Review of Systems Review of Systems  Constitutional:  Positive for appetite change and fever. Negative for activity change, chills, diaphoresis, fatigue and unexpected weight change.  HENT:  Positive for congestion, rhinorrhea and sinus pain. Negative for dental problem, drooling, ear discharge, ear pain, facial swelling, hearing loss, mouth sores, nosebleeds, postnasal drip, sinus pressure, sneezing, sore throat, tinnitus, trouble swallowing and voice change.   Respiratory:  Positive for cough and wheezing. Negative for apnea, choking, chest tightness, shortness of breath and stridor.   Gastrointestinal: Negative.   Skin: Negative.   Neurological:  Positive for headaches. Negative for dizziness, tremors, seizures, syncope,  facial asymmetry, speech difficulty, weakness, light-headedness and numbness.    Physical Exam Triage Vital Signs ED Triage Vitals  Enc Vitals Group     BP 11/30/20 0851 129/76     Pulse Rate 11/30/20 0851 87     Resp 11/30/20 0851 18     Temp 11/30/20 0851 99.1 F (37.3 C)     Temp Source 11/30/20 0851 Oral     SpO2 11/30/20 0851 98 %     Weight --      Height --      Head Circumference --      Peak Flow --      Pain Score 11/30/20 0849 8     Pain Loc --       Pain Edu? --      Excl. in [place]? --    No data found.  Updated Vital Signs BP 129/76 (BP Location: Left Arm)   Pulse 87   Temp 99.1 F (37.3 C) (Oral)   Resp 18   SpO2 98%   Visual Acuity Right Eye Distance:   Left Eye Distance:   Bilateral Distance:    Right Eye Near:   Left Eye Near:    Bilateral Near:     Physical Exam Constitutional:      Appearance: She is well-developed and normal weight.  HENT:     Head: Normocephalic.     Right Ear: Tympanic membrane, ear canal and external ear normal.     Left Ear: Tympanic membrane, ear canal and external ear normal.     Nose: Congestion present. No rhinorrhea.     Mouth/Throat:     Mouth: Mucous membranes are moist.     Pharynx: Posterior oropharyngeal erythema present.  Eyes:     Extraocular Movements: Extraocular movements intact.  Cardiovascular:     Rate and Rhythm: Normal rate and regular rhythm.     Pulses: Normal pulses.     Heart sounds: Normal heart sounds.  Pulmonary:     Effort: Pulmonary effort is normal.     Breath sounds: Normal breath sounds.  Abdominal:     General: Bowel sounds are normal.  Musculoskeletal:     Cervical back: Normal range of motion and neck supple.  Skin:    General: Skin is warm and dry.  Neurological:     Mental Status: She is alert and oriented to person, place, and time. Mental status is at baseline.  Psychiatric:        Mood and Affect: Mood normal.        Behavior: Behavior normal.     UC Treatments / Results  Labs (all labs ordered are listed, but only abnormal results are displayed) Labs Reviewed - No data to display  EKG   Radiology No results found.  Procedures Procedures (including critical care time)  Medications Ordered in UC Medications - No data to display  Initial Impression / Assessment and Plan / UC Course  I have reviewed the triage vital signs and the nursing notes.  Pertinent labs & imaging results that were available during my care of the  patient were reviewed by me and considered in my medical decision making (see chart for details).  Acute recurrent maxillary sinusitis  1.  Doxycycline 100 mg twice daily for 7 days, recent use of Augmentin, given walking referral to [facility] for further evaluation  2.  COVID and flu testing pending, patient would like antivirals if positive, discussed use 3.  Over-the-counter medications for symptom management  4.  Urgent care follow-up as needed Final Clinical Impressions(s) / UC Diagnoses   Final diagnoses:  None   Discharge Instructions   None    ED Prescriptions   None    PDMP not reviewed this encounter.   Valinda Hoar, [place] 11/30/20 [phone]

## 2020-11-30 NOTE — ED Triage Notes (Signed)
Facial pain/swelling, body aches and fever x 3 days

## 2020-11-30 NOTE — Discharge Instructions (Signed)
We will contact you if your COVID of flu  test is positive.  Please quarantine while you wait for the results.  If your test is negative you may resume normal activities.  If your test is positive please continue to quarantine for at least 5 days from your symptom onset or until you are without a fever for at least 24 hours after the medications.  Take doxycyline 100 mg bid for 7 days, if this is a viral infection which it most likely is this antibiotic will not work, if may take up to 7 -10 days before you truly begin to feel better   Please follow up with the ear nose throat specialist for evaluation of your reoccurring sinus infections     You can take Tylenol and/or Ibuprofen as needed for fever reduction and pain relief.   For cough: honey 1/2 to 1 teaspoon (you can dilute the honey in water or another fluid).  You can also use guaifenesin and dextromethorphan for cough. You can use a humidifier for chest congestion and cough.  If you don't have a humidifier, you can sit in the bathroom with the hot shower running.      For sore throat: try warm salt water gargles, cepacol lozenges, throat spray, warm tea or water with lemon/honey, popsicles or ice, or OTC cold relief medicine for throat discomfort.   For congestion: take a daily anti-histamine like Zyrtec, Claritin, and a oral decongestant, such as pseudoephedrine.  You can also use Flonase 1-2 sprays in each nostril daily.   It is important to stay hydrated: drink plenty of fluids (water, gatorade/powerade/pedialyte, juices, or teas) to keep your throat moisturized and help further relieve irritation/discomfort.

## 2020-12-01 LAB — SARS CORONAVIRUS 2 (TAT 6-24 HRS): SARS Coronavirus 2: NEGATIVE

## 2021-05-25 ENCOUNTER — Other Ambulatory Visit: Payer: Self-pay

## 2021-05-25 DIAGNOSIS — K760 Fatty (change of) liver, not elsewhere classified: Secondary | ICD-10-CM | POA: Insufficient documentation

## 2021-05-25 DIAGNOSIS — R109 Unspecified abdominal pain: Secondary | ICD-10-CM | POA: Diagnosis present

## 2021-05-25 DIAGNOSIS — R110 Nausea: Secondary | ICD-10-CM | POA: Diagnosis not present

## 2021-05-25 DIAGNOSIS — Z7951 Long term (current) use of inhaled steroids: Secondary | ICD-10-CM | POA: Diagnosis not present

## 2021-05-25 DIAGNOSIS — N281 Cyst of kidney, acquired: Secondary | ICD-10-CM | POA: Insufficient documentation

## 2021-05-25 DIAGNOSIS — J45909 Unspecified asthma, uncomplicated: Secondary | ICD-10-CM | POA: Insufficient documentation

## 2021-05-25 DIAGNOSIS — I10 Essential (primary) hypertension: Secondary | ICD-10-CM | POA: Insufficient documentation

## 2021-05-25 DIAGNOSIS — Z79899 Other long term (current) drug therapy: Secondary | ICD-10-CM | POA: Insufficient documentation

## 2021-05-25 LAB — COMPREHENSIVE METABOLIC PANEL
ALT: 15 U/L (ref 0–44)
AST: 18 U/L (ref 15–41)
Albumin: 3.6 g/dL (ref 3.5–5.0)
Alkaline Phosphatase: 72 U/L (ref 38–126)
Anion gap: 6 (ref 5–15)
BUN: 11 mg/dL (ref 6–20)
CO2: 25 mmol/L (ref 22–32)
Calcium: 9.2 mg/dL (ref 8.9–10.3)
Chloride: 111 mmol/L (ref 98–111)
Creatinine, Ser: 0.59 mg/dL (ref 0.44–1.00)
GFR, Estimated: >60 mL/min (ref >60)
Glucose, Bld: 90 mg/dL (ref 70–99)
Potassium: 3.8 mmol/L (ref 3.5–5.1)
Sodium: 142 mmol/L (ref 135–145)
Total Bilirubin: 0.5 mg/dL (ref 0.3–1.2)
Total Protein: 7.8 g/dL (ref 6.5–8.1)

## 2021-05-25 LAB — URINALYSIS, ROUTINE W REFLEX MICROSCOPIC
Bilirubin Urine: NEGATIVE
Glucose, UA: NEGATIVE mg/dL
Hgb urine dipstick: NEGATIVE
Ketones, ur: NEGATIVE mg/dL
Leukocytes,Ua: NEGATIVE
Nitrite: NEGATIVE
Protein, ur: NEGATIVE mg/dL
Specific Gravity, Urine: 1.008 (ref 1.005–1.030)
pH: 6 (ref 5.0–8.0)

## 2021-05-25 LAB — CBC
HCT: 38.6 % (ref 36.0–46.0)
Hemoglobin: 12.5 g/dL (ref 12.0–15.0)
MCH: 28.5 pg (ref 26.0–34.0)
MCHC: 32.4 g/dL (ref 30.0–36.0)
MCV: 88.1 fL (ref 80.0–100.0)
Platelets: 367 10*3/uL (ref 150–400)
RBC: 4.38 MIL/uL (ref 3.87–5.11)
RDW: 14.6 % (ref 11.5–15.5)
WBC: 8.3 10*3/uL (ref 4.0–10.5)
nRBC: 0 % (ref 0.0–0.2)

## 2021-05-25 LAB — LIPASE, BLOOD: Lipase: 37 U/L (ref 11–51)

## 2021-05-25 NOTE — ED Triage Notes (Signed)
Pt with ruq to right flank pain since yesterday. Pt states is taking motrin, but pain is worsening. Pt denies known fever, hematuria.  ?

## 2021-05-26 ENCOUNTER — Emergency Department: Payer: 59

## 2021-05-26 ENCOUNTER — Emergency Department
Admission: EM | Admit: 2021-05-26 | Discharge: 2021-05-26 | Disposition: A | Discharge status: Home / Self Care | Payer: 59 | Attending: Emergency Medicine | Admitting: Emergency Medicine

## 2021-05-26 DIAGNOSIS — K760 Fatty (change of) liver, not elsewhere classified: Secondary | ICD-10-CM | POA: Diagnosis not present

## 2021-05-26 DIAGNOSIS — R109 Unspecified abdominal pain: Secondary | ICD-10-CM

## 2021-05-26 MED ORDER — IOHEXOL 300 MG/ML  SOLN
100.0000 mL | Freq: Once | INTRAMUSCULAR | Status: AC | PRN
  Administered 2021-05-26: 100 mL via INTRAVENOUS

## 2021-05-26 MED ORDER — ONDANSETRON 4 MG PO TBDP
4.0000 mg | ORAL_TABLET | Freq: Four times a day (QID) | ORAL | 0 refills | Status: DC | PRN
Start: 2021-05-26 — End: 2023-01-15

## 2021-05-26 MED ORDER — ONDANSETRON HCL 4 MG/2ML IJ SOLN
4.0000 mg | Freq: Once | INTRAMUSCULAR | Status: AC
  Administered 2021-05-26: 4 mg via INTRAVENOUS
  Filled 2021-05-26: qty 2

## 2021-05-26 MED ORDER — KETOROLAC TROMETHAMINE 30 MG/ML IJ SOLN
15.0000 mg | Freq: Once | INTRAMUSCULAR | Status: AC
  Administered 2021-05-26: 15 mg via INTRAVENOUS
  Filled 2021-05-26: qty 1

## 2021-05-26 MED ORDER — HYDROCODONE-ACETAMINOPHEN 5-325 MG PO TABS: 1.0000 | ORAL_TABLET | ORAL | 0 refills | Status: DC | PRN

## 2021-05-26 NOTE — ED Notes (Signed)
Pt to CT via stretcher at this time

## 2021-05-26 NOTE — Discharge Instructions (Addendum)
Your lab work, urine, right upper quadrant ultrasound looking at your gallbladder and CT of your abdomen pelvis did not show any acute pathology that would explain your right-sided abdominal pain.  I recommend that you follow-up with your primary care doctor on Monday if your symptoms have not resolved.  Please return to the emergency department if you have worsening abdominal pain, fever of 100.4 or higher, vomiting that does not stop, blood in your stool or blood in your urine. ? ? ?You are being provided a prescription for opiates (also known as narcotics) for pain control.  Opiates can be addictive and should only be used when absolutely necessary for pain control when other alternatives do not work.  We recommend you only use them for the recommended amount of time and only as prescribed.  Please do not take with other sedative medications or alcohol.  Please do not drive, operate machinery, make important decisions while taking opiates.  Please note that these medications can be addictive and have high abuse potential.  Patients can become addicted to narcotics after only taking them for a few days.  Please keep these medications locked away from children, teenagers or any family members with history of substance abuse.  Narcotic pain medicine may also make you constipated.  You may use over-the-counter medications such as MiraLAX, Colace to prevent constipation.  If you become constipated, you may use over-the-counter enemas as needed.  Itching and nausea are also common side effects of narcotic pain medication.  If you develop uncontrolled vomiting or a rash, please stop these medications and seek medical care. ? ?

## 2021-05-26 NOTE — ED Provider Notes (Signed)
? ?[facility] ?Provider Note ? ? ? Event Date/Time  ? First MD Initiated Contact with Patient 05/26/21 0003   ?  (approximate) ? ? ?History  ? ?Abdominal Pain ? ? ?HPI ? ?Karen Reed is a 61 y.o. female history of hypertension, Sjogren's disease who presents to the emergency department complaints of right-sided abdominal pain started yesterday.  She has had nausea but no vomiting or diarrhea.  No known fevers.  No dysuria, hematuria, vaginal bleeding or discharge.  No previous abdominal surgeries.  Has been taking Tylenol and ibuprofen at home without relief.  No aggravating relieving factors.  Has never had similar symptoms. ? ? ?History provided by patient. ? ? ? ?Past Medical History:  ?Diagnosis Date  ? Asthma   ? Bronchitis induced  ? H/O seasonal allergies   ? Heart palpitations   ? Hypertension   ? Sjogren's disease ([place])   ? ? ?Past Surgical History:  ?Procedure Laterality Date  ? NO PAST SURGERIES    ? ? ?MEDICATIONS:  ?Prior to Admission medications   ?Medication Sig Start Date End Date Taking? Authorizing Provider  ?benzonatate (TESSALON) 100 MG capsule Take 2 capsules (200 mg total) by mouth every 8 (eight) hours. 09/19/20   Margarette Canada, NP  ?doxycycline (VIBRAMYCIN) 100 MG capsule Take 1 capsule (100 mg total) by mouth 2 (two) times daily. 11/30/20   Hans Eden, NP  ?ipratropium (ATROVENT) 0.06 % nasal spray Place 2 sprays into both nostrils 4 (four) times daily. 09/19/20   Margarette Canada, NP  ?losartan (COZAAR) 25 MG tablet Take 25 mg by mouth daily. 05/14/19   [provider]  ?promethazine-dextromethorphan (PROMETHAZINE-DM) 6.25-15 MG/5ML syrup Take 5 mLs by mouth 4 (four) times daily as needed. 09/19/20   Margarette Canada, NP  ?famotidine (PEPCID) 20 MG tablet Take 1 tablet (20 mg total) by mouth daily. 11/17/18 06/19/19  Nance Pear, MD  ?sucralfate (CARAFATE) 1 g tablet Take 1 tablet (1 g total) by mouth 4 (four) times daily. 11/17/18 06/19/19  Nance Pear, MD   ? ? ?Physical Exam  ? ?Triage Vital Signs: ?ED Triage Vitals  ?Enc Vitals Group  ?   BP 05/25/21 2102 (!) 168/94  ?   Pulse Rate 05/25/21 2102 84  ?   Resp 05/25/21 2102 18  ?   Temp 05/25/21 2102 98.9 ?F (37.2 ?C)  ?   Temp Source 05/25/21 2102 Oral  ?   SpO2 05/25/21 2102 98 %  ?   Weight 05/25/21 2103 230 lb (104.3 kg)  ?   Height 05/25/21 2103 5\' 7"  (1.702 m)  ?   Head Circumference --   ?   Peak Flow --   ?   Pain Score 05/25/21 2103 7  ?   Pain Loc --   ?   Pain Edu? --   ?   Excl. in [place]? --   ? ? ?Most recent vital signs: ?Vitals:  ? 05/25/21 2102  ?BP: (!) 168/94  ?Pulse: 84  ?Resp: 18  ?Temp: 98.9 ?F (37.2 ?C)  ?SpO2: 98%  ? ? ?CONSTITUTIONAL: Alert and oriented and responds appropriately to questions. Well-appearing; well-nourished ?HEAD: Normocephalic, atraumatic ?EYES: Conjunctivae clear, pupils appear equal, sclera nonicteric ?ENT: normal nose; moist mucous membranes ?NECK: Supple, normal ROM ?CARD: RRR; S1 and S2 appreciated; no murmurs, no clicks, no rubs, no gallops ?RESP: Normal chest excursion without splinting or tachypnea; breath sounds clear and equal bilaterally; no wheezes, no rhonchi, no rales, no hypoxia or  respiratory distress, speaking full sentences ?ABD/GI: Normal bowel sounds; non-distended; soft, tender to palpation in the right upper quadrant with voluntary guarding, no rebound, no significant tenderness at McBurney's point ?BACK: The back appears normal, no CVA tenderness ?EXT: Normal ROM in all joints; no deformity noted, no edema; no cyanosis ?SKIN: Normal color for age and race; warm; no rash on exposed skin ?NEURO: Moves all extremities equally, normal speech ?PSYCH: The patient's mood and manner are appropriate. ? ? ?ED Results / Procedures / Treatments  ? ?LABS: ?(all labs ordered are listed, but only abnormal results are displayed) ?Labs Reviewed  ?URINALYSIS, ROUTINE W REFLEX MICROSCOPIC - Abnormal; Notable for the following components:  ?    Result Value  ? Color, Urine  STRAW (*)   ? APPearance CLEAR (*)   ? All other components within normal limits  ?LIPASE, BLOOD  ?COMPREHENSIVE METABOLIC PANEL  ?CBC  ? ? ? ?EKG: ? EKG Interpretation ? ?Date/Time:  Friday May 25 2021 21:06:21 EDT ?Ventricular Rate:  82 ?PR Interval:  168 ?QRS Duration: 78 ?QT Interval:  392 ?QTC Calculation: 457 ?R Axis:   96 ?Text Interpretation: Normal sinus rhythm Rightward axis Cannot rule out Anterior infarct , age undetermined Abnormal ECG When compared with ECG of 10-Apr-2020 22:19, No significant change was found Confirmed by Pryor Curia [phone]) on 05/26/2021 12:26:45 AM ?  ? ?  ? ? ? ?RADIOLOGY: ?My personal review and interpretation of imaging: Right upper quadrant ultrasound shows normal-appearing gallbladder.  CT of the abdomen pelvis shows normal appendix and no other acute abnormality. ? ?I have personally reviewed all radiology reports.   ?CT ABDOMEN PELVIS W CONTRAST ? ?Result Date: 05/26/2021 ?CLINICAL DATA:  Right flank pain. EXAM: CT ABDOMEN AND PELVIS WITH CONTRAST TECHNIQUE: Multidetector CT imaging of the abdomen and pelvis was performed using the standard protocol following bolus administration of intravenous contrast. RADIATION DOSE REDUCTION: This exam was performed according to the departmental dose-optimization program which includes automated exposure control, adjustment of the mA and/or kV according to patient size and/or use of iterative reconstruction technique. CONTRAST:  114mL OMNIPAQUE IOHEXOL 300 MG/ML  SOLN COMPARISON:  None Available. FINDINGS: Lower chest: Mild, stable left basilar linear scarring and/or atelectasis is seen. Hepatobiliary: No focal liver abnormality is seen. No gallstones, gallbladder wall thickening, or biliary dilatation. Pancreas: Unremarkable. No pancreatic ductal dilatation or surrounding inflammatory changes. Spleen: Normal in size without focal abnormality. Adrenals/Urinary Tract: Adrenal glands are unremarkable. Kidneys are normal in size, without  renal calculi or hydronephrosis. A 1.5 cm diameter simple cyst is seen within the anterior aspect of the mid left kidney. No additional follow-up or imaging is recommended. The urinary bladder is poorly distended and otherwise unremarkable. Stomach/Bowel: Stomach is within normal limits. Appendix appears normal. No evidence of bowel wall thickening, distention, or inflammatory changes. Vascular/Lymphatic: Aortic atherosclerosis. No enlarged abdominal or pelvic lymph nodes. Reproductive: Uterus and bilateral adnexa are unremarkable. Other: No abdominal wall hernia or abnormality. No abdominopelvic ascites. Musculoskeletal: No acute or significant osseous findings. IMPRESSION: 1. No acute or active process within the abdomen or pelvis. 2. Aortic atherosclerosis. Aortic Atherosclerosis (ICD10-I70.0). Electronically Signed   By: Virgina Norfolk M.D.   On: 05/26/2021 01:42  ? ?US ABDOMEN LIMITED RUQ (LIVER/GB) ? ?Result Date: 05/26/2021 ?CLINICAL DATA:  Right upper quadrant pain for 1 day EXAM: ULTRASOUND ABDOMEN LIMITED RIGHT UPPER QUADRANT COMPARISON:  None Available. FINDINGS: Gallbladder: No gallstones or wall thickening visualized. No sonographic Murphy sign noted by sonographer. Common bile duct: Diameter:  4.8 mm. Liver: Mildly increased in echogenicity without focal mass. Portal vein is patent on color Doppler imaging with normal direction of blood flow towards the liver. Other: None. IMPRESSION: Fatty liver. No other focal abnormality is noted. Electronically Signed   By: Inez Catalina M.D.   On: 05/26/2021 01:01   ? ? ?PROCEDURES: ? ?Critical Care performed: No ? ? ? ? ?Procedures ? ? ? ?IMPRESSION / MDM / ASSESSMENT AND PLAN / ED COURSE  ?I reviewed the triage vital signs and the nursing notes. ? ? ? ?Patient here with right-sided abdominal pain, nausea. ? ?The patient is on the cardiac monitor to evaluate for evidence of arrhythmia and/or significant heart rate changes. ? ? ?DIFFERENTIAL DIAGNOSIS (includes  but not limited to):   Cholelithiasis, cholecystitis, pancreatitis, gastritis, appendicitis, kidney stone, pyelonephritis, UTI ? ? ?PLAN: We will obtain CBC, CMP, lipase, urinalysis, right upper quad ultrasound.

## 2021-07-03 ENCOUNTER — Ambulatory Visit
Admission: EM | Admit: 2021-07-03 | Discharge: 2021-07-03 | Disposition: A | Discharge status: Home / Self Care | Payer: 59 | Attending: Physician Assistant | Admitting: Physician Assistant

## 2021-07-03 DIAGNOSIS — R1031 Right lower quadrant pain: Secondary | ICD-10-CM | POA: Diagnosis not present

## 2021-07-03 DIAGNOSIS — S76211A Strain of adductor muscle, fascia and tendon of right thigh, initial encounter: Secondary | ICD-10-CM

## 2021-07-03 MED ORDER — NAPROXEN 500 MG PO TABS: 500.0000 mg | ORAL_TABLET | Freq: Two times a day (BID) | ORAL | 0 refills | Status: AC | PRN

## 2021-07-03 MED ORDER — CYCLOBENZAPRINE HCL 10 MG PO TABS: 10.0000 mg | ORAL_TABLET | Freq: Three times a day (TID) | ORAL | 0 refills | Status: AC | PRN

## 2021-07-03 NOTE — Discharge Instructions (Addendum)
-  You have a lot of muscle tenderness.  I have extremely low suspicion for a blood clot.  I think you have pulled some muscles in your groin and thigh.  I have advised a muscle relaxer, anti-inflammatory medication, Tylenol, local heat application and topical muscle rubs.   - If you are not feeling better in a week or symptoms worsen, please follow-up with your primary care provider for reevaluation.

## 2021-07-03 NOTE — ED Triage Notes (Signed)
Patient presents to Urgent Care with complaints of right groin pain x 3 days ago. No changes in activity. Pain worse when lifting right leg or turning. She states she feels like its a knot on groin site.  Treating symptoms with tylenol.

## 2021-07-03 NOTE — ED Provider Notes (Signed)
[facility]    CSN: [id number] Arrival date & time: 07/03/21  1026      History   Chief Complaint Chief Complaint  Patient presents with   Leg Pain    Right leg     HPI Karen Reed is a 61 y.o. female presenting for right groin pain for the past 3 to 4 days.  Patient denies any injury.  States she has no swelling, redness, rashes in the area.  Reports constant achy pain.  Pain worse with hip flexion and internal rotation.  Reports history of chronic back pain but denies it being any worse than normal.  Denies any radiation of this pain to her lower extremities or groin in the past.  She has not had any associated abdominal pain, urinary symptoms.  No history of hernias.  Patient cannot think of anything that could cause the pain.  She has not been more active than normal recently.  She has tried Tylenol without improvement in her symptoms.  Patient concerned about DVT but states she has never had a DVT.  She does have history of claudication and chronic venous insufficiency as well as hypertension, prediabetes, obesity and asthma.  No other complaints.  HPI  Past Medical History:  Diagnosis Date   Asthma    Bronchitis induced   H/O seasonal allergies    Heart palpitations    Hypertension    Sjogren's disease (HCC)     Patient Active Problem List   Diagnosis Date Noted   Anxiety 08/03/2020   OSA (obstructive sleep apnea) 08/03/2020   Partial Achilles tendon tear, left, initial encounter 08/03/2020   Acute stress reaction 09/08/2019   Adjustment reaction 09/08/2019   Mixed hyperlipidemia 08/13/2019   Sciatica 08/13/2019   Neuropathic pain 08/09/2019   Hx of trauma 05/05/2018   Palpitations 02/12/2018   Abnormal CT scan, neck 08/20/2017   Polyarthralgia 07/03/2017   Submandibular gland swelling 07/03/2017   Prediabetes 06/18/2017   Essential hypertension 06/11/2017   Sjogren's syndrome with keratoconjunctivitis sicca (HCC) 06/11/2017   Lymphadenopathy of  head and neck region 03/05/2016   Chest pain, unspecified 07/11/2014   Bronchitis due to tobacco use 04/01/2013   Obesity (BMI 30.0-34.9) 04/01/2013   Osteoarthrosis involving lower leg 04/01/2013   Simple chronic bronchitis (HCC) 04/01/2013   Tobacco dependence 04/01/2013   Tobacco use disorder 04/01/2013   Venous insufficiency (chronic) (peripheral) 04/01/2013   MDD (major depressive disorder), recurrent episode, severe (HCC) 05/04/2012   Insomnia 04/27/2012   Major depressive disorder, single episode 04/27/2012   Enthesopathy of knee 03/27/2010    Past Surgical History:  Procedure Laterality Date   NO PAST SURGERIES      OB History   No obstetric history on file.      Home Medications    Prior to Admission medications   Medication Sig Start Date End Date Taking? Authorizing Provider  cyclobenzaprine (FLEXERIL) 10 MG tablet Take 1 tablet (10 mg total) by mouth 3 (three) times daily as needed for up to 7 days for muscle spasms. 07/03/21 07/10/21 Yes Eusebio Friendly B, PA-C  naproxen (NAPROSYN) 500 MG tablet Take 1 tablet (500 mg total) by mouth 2 (two) times daily as needed for up to 10 days. 07/03/21 07/13/21 Yes Shirlee Latch, PA-C  HYDROcodone-acetaminophen (NORCO/VICODIN) 5-325 MG tablet Take 1 tablet by mouth every 4 (four) hours as needed. 05/26/21   Ward, Layla Maw, DO  losartan (COZAAR) 25 MG tablet Take 25 mg by mouth daily. 05/14/19  [provider]  ondansetron (ZOFRAN-ODT) 4 MG disintegrating tablet Take 1 tablet (4 mg total) by mouth every 6 (six) hours as needed for nausea or vomiting. 05/26/21   Ward, Layla Maw, DO  famotidine (PEPCID) 20 MG tablet Take 1 tablet (20 mg total) by mouth daily. 11/17/18 06/19/19  Phineas Semen, MD  sucralfate (CARAFATE) 1 g tablet Take 1 tablet (1 g total) by mouth 4 (four) times daily. 11/17/18 06/19/19  Phineas Semen, MD    Family History Family History  Problem Relation Age of Onset   Other Mother        unknown medical  history   Heart failure Father    Hypertension Father     Social History Social History   Tobacco Use   Smoking status: Every Day    Packs/day: 0.50    Types: Cigarettes   Smokeless tobacco: Never  Vaping Use   Vaping Use: Never used  Substance Use Topics   Alcohol use: No   Drug use: No     Allergies   Atorvastatin, Biaxin [clarithromycin], Codeine, Levaquin [levofloxacin], Lidocaine, Lisinopril, and Sulfa antibiotics   Review of Systems Review of Systems  Gastrointestinal:  Negative for abdominal pain, diarrhea, nausea and vomiting.  Genitourinary:  Negative for dysuria and flank pain.  Musculoskeletal:  Positive for arthralgias (right hip/groin) and back pain (chronic, no worse than baseline). Negative for gait problem and joint swelling.  Skin:  Negative for color change, rash and wound.  Neurological:  Negative for weakness and numbness.     Physical Exam Triage Vital Signs ED Triage Vitals  Enc Vitals Group     BP 07/03/21 1125 135/88     Pulse Rate 07/03/21 1125 82     Resp 07/03/21 1125 18     Temp 07/03/21 1125 99 F (37.2 C)     Temp Source 07/03/21 1125 Oral     SpO2 07/03/21 1125 97 %     Weight --      Height --      Head Circumference --      Peak Flow --      Pain Score 07/03/21 1123 8     Pain Loc --      Pain Edu? --      Excl. in [place]? --    No data found.  Updated Vital Signs BP 135/88 (BP Location: Left Arm)   Pulse 82   Temp 99 F (37.2 C) (Oral)   Resp 18   SpO2 97%      Physical Exam Vitals and nursing note reviewed.  Constitutional:      General: She is not in acute distress.    Appearance: Normal appearance. She is not ill-appearing or toxic-appearing.  HENT:     Head: Normocephalic and atraumatic.  Eyes:     General: No scleral icterus.       Right eye: No discharge.        Left eye: No discharge.     Conjunctiva/sclera: Conjunctivae normal.  Cardiovascular:     Rate and Rhythm: Normal rate and regular rhythm.      Heart sounds: Normal heart sounds.  Pulmonary:     Effort: Pulmonary effort is normal. No respiratory distress.     Breath sounds: Normal breath sounds.  Musculoskeletal:     Cervical back: Neck supple.     Lumbar back: No tenderness. Normal range of motion.     Right hip: Tenderness (hip adductor musculature) present. No bony tenderness or  crepitus. Decreased range of motion (internal rotation and flexion due to pain/guarding). Normal strength.  Skin:    General: Skin is dry.  Neurological:     General: No focal deficit present.     Mental Status: She is alert. Mental status is at baseline.     Motor: No weakness.     Gait: Gait normal.  Psychiatric:        Mood and Affect: Mood normal.        Behavior: Behavior normal.        Thought Content: Thought content normal.      UC Treatments / Results  Labs (all labs ordered are listed, but only abnormal results are displayed) Labs Reviewed - No data to display  EKG   Radiology No results found.  Procedures Procedures (including critical care time)  Medications Ordered in UC Medications - No data to display  Initial Impression / Assessment and Plan / UC Course  I have reviewed the triage vital signs and the nursing notes.  Pertinent labs & imaging results that were available during my care of the patient were reviewed by me and considered in my medical decision making (see chart for details).  61 year old female presenting for right groin pain for the past 2 to 3 days.  Denies any inciting injury or change to normal routine.  No similar problems in the past.  History of chronic back pain but is never had radiation of pain to groin.  No associate abdominal pain or urinary symptoms.  Vitals normal and stable.  Patient is overall well-appearing.  Inspection of right groin area reveals no observable abnormality.  No overlying erythema, swelling, lacerations, abrasions, ecchymosis, abscesses.  No hernias.  She has tenderness  palpation about the hip adductors and increased pain with hip flexion and internal rotation/abduction.  No tenderness palpation of her back at this time.  Suspect groin strain.  Reviewed RICE guidelines.  Sent naproxen to pharmacy as well as Flexeril and advised topical muscle rubs and heating pad.  Advised following up with PCP if not improving over the next 1 week or if symptoms worsen.   Final Clinical Impressions(s) / UC Diagnoses   Final diagnoses:  Inguinal strain, right, initial encounter  Right groin pain     Discharge Instructions      -You have a lot of muscle tenderness.  I have extremely low suspicion for a blood clot.  I think you have pulled some muscles in your groin and thigh.  I have advised a muscle relaxer, anti-inflammatory medication, Tylenol, local heat application and topical muscle rubs.   - If you are not feeling better in a week or symptoms worsen, please follow-up with your primary care provider for reevaluation.    ED Prescriptions     Medication Sig Dispense Auth. Provider   naproxen (NAPROSYN) 500 MG tablet Take 1 tablet (500 mg total) by mouth 2 (two) times daily as needed for up to 10 days. 20 tablet Eusebio Friendly B, PA-C   cyclobenzaprine (FLEXERIL) 10 MG tablet Take 1 tablet (10 mg total) by mouth 3 (three) times daily as needed for up to 7 days for muscle spasms. 20 tablet Gareth Morgan      PDMP not reviewed this encounter.   Shirlee Latch, PA-C 07/03/21 1218

## 2021-09-17 ENCOUNTER — Ambulatory Visit (INDEPENDENT_AMBULATORY_CARE_PROVIDER_SITE_OTHER): Payer: 59

## 2021-09-17 ENCOUNTER — Ambulatory Visit
Admission: EM | Admit: 2021-09-17 | Discharge: 2021-09-17 | Disposition: A | Discharge status: Home / Self Care | Payer: 59 | Attending: Physician Assistant | Admitting: Physician Assistant

## 2021-09-17 DIAGNOSIS — M1711 Unilateral primary osteoarthritis, right knee: Secondary | ICD-10-CM | POA: Diagnosis not present

## 2021-09-17 DIAGNOSIS — M25561 Pain in right knee: Secondary | ICD-10-CM

## 2021-09-17 MED ORDER — NAPROXEN 375 MG PO TABS: 375.0000 mg | ORAL_TABLET | Freq: Two times a day (BID) | ORAL | 0 refills | Status: DC

## 2021-09-17 NOTE — Discharge Instructions (Signed)
Your x-ray showed that you have arthritis.  Please take Naprosyn twice daily for pain.  Do not take NSAIDs including aspirin, ibuprofen/Advil, naproxen/Aleve with this medication as it can cause stomach bleeding.  Keep your knee elevated and use ice as well as compression for symptom relief.  I do think you would benefit from seeing an orthopedic provider.  Please call them to schedule an appointment.  If anything worsens you should be seen immediately.

## 2021-09-17 NOTE — ED Triage Notes (Signed)
Patient presents that when she moves her knee it feels like bones are rubbing together.   Patient reports this started about a few months ago and getting worse over the past 2 weeks.

## 2021-09-17 NOTE — ED Provider Notes (Signed)
[facility]    CSN: [id number] Arrival date & time: 09/17/21  1342      History   Chief Complaint Chief Complaint  Patient presents with   Knee Pain    Right     HPI Karen Reed is a 61 y.o. female.   Patient presents today with a several month history of right knee pain.  Reports pain is rated 4 on a 0-10 pain scale but increases to 8 with certain movements including ambulation, no aggravating or alleviating factors identified.  She has tried multiple over-the-counter medication including Tylenol ibuprofen.  She is also tried topical medications without improvement of symptoms.  Denies previous injury or surgery involving her knee.  Denies any numbness or tingling in her foot.  She denies any change in activity prior to symptom onset.  Does report occasional popping, clicking, instability but denies any falls relating from this.  She does report associated crepitus particularly with flexion.  She is having difficulty with daily activities as result of symptoms.    Past Medical History:  Diagnosis Date   Asthma    Bronchitis induced   H/O seasonal allergies    Heart palpitations    Hypertension    Sjogren's disease (HCC)     Patient Active Problem List   Diagnosis Date Noted   Anxiety 08/03/2020   OSA (obstructive sleep apnea) 08/03/2020   Partial Achilles tendon tear, left, initial encounter 08/03/2020   Acute stress reaction 09/08/2019   Adjustment reaction 09/08/2019   Mixed hyperlipidemia 08/13/2019   Sciatica 08/13/2019   Neuropathic pain 08/09/2019   Hx of trauma 05/05/2018   Palpitations 02/12/2018   Abnormal CT scan, neck 08/20/2017   Polyarthralgia 07/03/2017   Submandibular gland swelling 07/03/2017   Prediabetes 06/18/2017   Essential hypertension 06/11/2017   Sjogren's syndrome with keratoconjunctivitis sicca (HCC) 06/11/2017   Lymphadenopathy of head and neck region 03/05/2016   Chest pain, unspecified 07/11/2014   Bronchitis due to  tobacco use 04/01/2013   Obesity (BMI 30.0-34.9) 04/01/2013   Osteoarthrosis involving lower leg 04/01/2013   Simple chronic bronchitis (HCC) 04/01/2013   Tobacco dependence 04/01/2013   Tobacco use disorder 04/01/2013   Venous insufficiency (chronic) (peripheral) 04/01/2013   MDD (major depressive disorder), recurrent episode, severe (HCC) 05/04/2012   Insomnia 04/27/2012   Major depressive disorder, single episode 04/27/2012   Enthesopathy of knee 03/27/2010    Past Surgical History:  Procedure Laterality Date   NO PAST SURGERIES      OB History   No obstetric history on file.      Home Medications    Prior to Admission medications   Medication Sig Start Date End Date Taking? Authorizing Provider  naproxen (NAPROSYN) 375 MG tablet Take 1 tablet (375 mg total) by mouth 2 (two) times daily. 09/17/21  Yes Cailyn Houdek, Noberto Retort, PA-C  HYDROcodone-acetaminophen (NORCO/VICODIN) 5-325 MG tablet Take 1 tablet by mouth every 4 (four) hours as needed. 05/26/21   Ward, Layla Maw, DO  losartan (COZAAR) 25 MG tablet Take 25 mg by mouth daily. 05/14/19   [provider]  ondansetron (ZOFRAN-ODT) 4 MG disintegrating tablet Take 1 tablet (4 mg total) by mouth every 6 (six) hours as needed for nausea or vomiting. 05/26/21   Ward, Layla Maw, DO  famotidine (PEPCID) 20 MG tablet Take 1 tablet (20 mg total) by mouth daily. 11/17/18 06/19/19  Phineas Semen, MD  sucralfate (CARAFATE) 1 g tablet Take 1 tablet (1 g total) by mouth 4 (four) times  daily. 11/17/18 06/19/19  Phineas Semen, MD    Family History Family History  Problem Relation Age of Onset   Other Mother        unknown medical history   Heart failure Father    Hypertension Father     Social History Social History   Tobacco Use   Smoking status: Every Day    Packs/day: 0.50    Types: Cigarettes   Smokeless tobacco: Never  Vaping Use   Vaping Use: Never used  Substance Use Topics   Alcohol use: No   Drug use: No      Allergies   Atorvastatin, Biaxin [clarithromycin], Codeine, Levaquin [levofloxacin], Lidocaine, Lisinopril, and Sulfa antibiotics   Review of Systems Review of Systems  Constitutional:  Positive for activity change. Negative for appetite change, fatigue and fever.  Musculoskeletal:  Positive for arthralgias, gait problem and joint swelling. Negative for myalgias.  Skin:  Negative for color change and wound.  Neurological:  Negative for dizziness, weakness, light-headedness, numbness and headaches.     Physical Exam Triage Vital Signs ED Triage Vitals  Enc Vitals Group     BP 09/17/21 1417 123/76     Pulse Rate 09/17/21 1417 71     Resp --      Temp 09/17/21 1417 98.1 F (36.7 C)     Temp Source 09/17/21 1417 Oral     SpO2 09/17/21 1417 98 %     Weight 09/17/21 1415 230 lb (104.3 kg)     Height 09/17/21 1415 5\' 7"  (1.702 m)     Head Circumference --      Peak Flow --      Pain Score 09/17/21 1415 5     Pain Loc --      Pain Edu? --      Excl. in GC? --    No data found.  Updated Vital Signs BP 123/76 (BP Location: Left Arm)   Pulse 71   Temp 98.1 F (36.7 C) (Oral)   Ht 5\' 7"  (1.702 m)   Wt 230 lb (104.3 kg)   SpO2 98%   BMI 36.02 kg/m   Visual Acuity Right Eye Distance:   Left Eye Distance:   Bilateral Distance:    Right Eye Near:   Left Eye Near:    Bilateral Near:     Physical Exam Vitals reviewed.  Constitutional:      General: She is awake. She is not in acute distress.    Appearance: Normal appearance. She is well-developed. She is not ill-appearing.     Comments: Very pleasant female appears stated age in no acute distress sitting comfortably in exam room  HENT:     Head: Normocephalic and atraumatic.  Cardiovascular:     Rate and Rhythm: Normal rate and regular rhythm.     Heart sounds: Normal heart sounds, S1 normal and S2 normal. No murmur heard. Pulmonary:     Effort: Pulmonary effort is normal.     Breath sounds: Normal breath  sounds. No wheezing, rhonchi or rales.     Comments: Clear to auscultation bilaterally Abdominal:     General: Bowel sounds are normal.     Palpations: Abdomen is soft.     Tenderness: There is no abdominal tenderness. There is no right CVA tenderness, left CVA tenderness, guarding or rebound.  Musculoskeletal:     Right knee: Swelling, effusion and crepitus present. Decreased range of motion. Tenderness present over the medial joint line and lateral joint line. No  LCL laxity, MCL laxity, ACL laxity or PCL laxity.     Comments: Right knee: Tenderness palpation along inferior joint line.  Crepitus noted with flexion and extension.  No ligamentous laxity identified on physical exam.  Psychiatric:        Behavior: Behavior is cooperative.      UC Treatments / Results  Labs (all labs ordered are listed, but only abnormal results are displayed) Labs Reviewed - No data to display  EKG   Radiology DG Knee Complete 4 Views Right  Result Date: 09/17/2021 CLINICAL DATA:  Pain EXAM: RIGHT KNEE - COMPLETE 4+ VIEW COMPARISON:  12/27/2017 FINDINGS: No fracture or dislocation is seen. Degenerative changes are noted with bony spurs in medial, lateral and patellofemoral compartments. There is narrowing of joint space in the medial compartment. There is interval progression of degenerative changes. Small effusion is present in suprapatellar bursa. IMPRESSION: No fracture or dislocation is seen. Degenerative changes are noted, more so in the medial compartment. Small effusion is present in suprapatellar bursa. Electronically Signed   By: Ernie Avena M.D.   On: 09/17/2021 15:15    Procedures Procedures (including critical care time)  Medications Ordered in UC Medications - No data to display  Initial Impression / Assessment and Plan / UC Course  I have reviewed the triage vital signs and the nursing notes.  Pertinent labs & imaging results that were available during my care of the patient  were reviewed by me and considered in my medical decision making (see chart for details).     X-ray obtained given severity of pain showed degenerative changes consistent with osteoarthritis without acute fracture or dislocation.  Discussed symptoms with patient.  Recommended that she use conservative treatment measures including RICE protocol and brace for symptom relief.  She was prescribed Naprosyn for pain relief with instruction not to take additional NSAIDs with this medication due to risk of GI bleeding.  She denies history of chronic kidney disease.  She can use Tylenol for breakthrough pain.  Discussed that ultimately she would benefit from seeing an orthopedic provider.  She was given contact information for local provider with instruction to call to schedule an appointment.  Discussed that if she has any worsening symptoms she needs to return for reevaluation.  Strict return precautions given.  Excuse note provided.  Final Clinical Impressions(s) / UC Diagnoses   Final diagnoses:  Primary osteoarthritis of right knee     Discharge Instructions      Your x-ray showed that you have arthritis.  Please take Naprosyn twice daily for pain.  Do not take NSAIDs including aspirin, ibuprofen/Advil, naproxen/Aleve with this medication as it can cause stomach bleeding.  Keep your knee elevated and use ice as well as compression for symptom relief.  I do think you would benefit from seeing an orthopedic provider.  Please call them to schedule an appointment.  If anything worsens you should be seen immediately.     ED Prescriptions     Medication Sig Dispense Auth. Provider   naproxen (NAPROSYN) 375 MG tablet Take 1 tablet (375 mg total) by mouth 2 (two) times daily. 20 tablet Hanaa Payes, Noberto Retort, PA-C      PDMP not reviewed this encounter.   Jeani Hawking, PA-C 09/17/21 1545

## 2021-09-21 ENCOUNTER — Other Ambulatory Visit: Payer: Self-pay | Admitting: Family Medicine

## 2021-09-21 DIAGNOSIS — Z1231 Encounter for screening mammogram for malignant neoplasm of breast: Secondary | ICD-10-CM

## 2021-09-27 ENCOUNTER — Other Ambulatory Visit: Payer: Self-pay | Admitting: Family Medicine

## 2021-09-27 DIAGNOSIS — Z1231 Encounter for screening mammogram for malignant neoplasm of breast: Secondary | ICD-10-CM

## 2021-10-20 ENCOUNTER — Encounter: Payer: Self-pay | Admitting: Emergency Medicine

## 2021-10-20 ENCOUNTER — Ambulatory Visit
Admission: EM | Admit: 2021-10-20 | Discharge: 2021-10-20 | Disposition: A | Discharge status: Home / Self Care | Payer: 59 | Attending: Emergency Medicine | Admitting: Emergency Medicine

## 2021-10-20 DIAGNOSIS — Z1152 Encounter for screening for COVID-19: Secondary | ICD-10-CM | POA: Insufficient documentation

## 2021-10-20 DIAGNOSIS — Z79899 Other long term (current) drug therapy: Secondary | ICD-10-CM | POA: Insufficient documentation

## 2021-10-20 DIAGNOSIS — R059 Cough, unspecified: Secondary | ICD-10-CM | POA: Diagnosis not present

## 2021-10-20 DIAGNOSIS — J069 Acute upper respiratory infection, unspecified: Secondary | ICD-10-CM | POA: Diagnosis present

## 2021-10-20 LAB — SARS CORONAVIRUS 2 BY RT PCR: SARS Coronavirus 2 by RT PCR: NEGATIVE

## 2021-10-20 MED ORDER — BENZONATATE 100 MG PO CAPS: 200.0000 mg | ORAL_CAPSULE | Freq: Three times a day (TID) | ORAL | 0 refills | Status: DC

## 2021-10-20 MED ORDER — IPRATROPIUM BROMIDE 0.06 % NA SOLN: 2.0000 | Freq: Four times a day (QID) | NASAL | 12 refills | Status: DC

## 2021-10-20 MED ORDER — PROMETHAZINE-DM 6.25-15 MG/5ML PO SYRP: 5.0000 mL | ORAL_SOLUTION | Freq: Four times a day (QID) | ORAL | 0 refills | Status: DC | PRN

## 2021-10-20 NOTE — ED Triage Notes (Signed)
Patient c/o sinus congestion, nasal congestion, cough and chest congestion that on Wed.  Patient reports chills.  Patient took home covid test on Wed and was negative.

## 2021-10-20 NOTE — Discharge Instructions (Addendum)
Your COVID test today was negative but your exam is consistent with a viral respiratory infection.  Use the Atrovent nasal spray, 2 squirts in each nostril every 6 hours, as needed for runny nose and postnasal drip.  Use the Tessalon Perles every 8 hours during the day.  Take them with a small sip of water.  They may give you some numbness to the base of your tongue or a metallic taste in your mouth, this is normal.  Use the Promethazine DM cough syrup at bedtime for cough and congestion.  It will make you drowsy so do not take it during the day.  Return for reevaluation or see your primary care provider for any new or worsening symptoms.

## 2021-10-20 NOTE — ED Provider Notes (Signed)
[facility]    CSN: [id number] Arrival date & time: 10/20/21  1329      History   Chief Complaint Chief Complaint  Patient presents with   Sinus Problem   Cough    HPI Karen Reed is a 61 y.o. female.   HPI  61 year old female here for evaluation of respiratory complaints.  Patient ports that she has had symptoms for the past 5 days and they include chills, nasal congestion with yellow nasal discharge, sinus pressure, a cough that is occasionally productive but mostly not, and wheezing at night.  She denies any measured fever, ear pain, sore throat, or shortness of breath.  Past Medical History:  Diagnosis Date   Asthma    Bronchitis induced   H/O seasonal allergies    Heart palpitations    Hypertension    Sjogren's disease (HCC)     Patient Active Problem List   Diagnosis Date Noted   Anxiety 08/03/2020   OSA (obstructive sleep apnea) 08/03/2020   Partial Achilles tendon tear, left, initial encounter 08/03/2020   Acute stress reaction 09/08/2019   Adjustment reaction 09/08/2019   Mixed hyperlipidemia 08/13/2019   Sciatica 08/13/2019   Neuropathic pain 08/09/2019   Hx of trauma 05/05/2018   Palpitations 02/12/2018   Abnormal CT scan, neck 08/20/2017   Polyarthralgia 07/03/2017   Submandibular gland swelling 07/03/2017   Prediabetes 06/18/2017   Essential hypertension 06/11/2017   Sjogren's syndrome with keratoconjunctivitis sicca (HCC) 06/11/2017   Lymphadenopathy of head and neck region 03/05/2016   Chest pain, unspecified 07/11/2014   Bronchitis due to tobacco use 04/01/2013   Obesity (BMI 30.0-34.9) 04/01/2013   Osteoarthrosis involving lower leg 04/01/2013   Simple chronic bronchitis (HCC) 04/01/2013   Tobacco dependence 04/01/2013   Tobacco use disorder 04/01/2013   Venous insufficiency (chronic) (peripheral) 04/01/2013   MDD (major depressive disorder), recurrent episode, severe (HCC) 05/04/2012   Insomnia 04/27/2012   Major  depressive disorder, single episode 04/27/2012   Enthesopathy of knee 03/27/2010    Past Surgical History:  Procedure Laterality Date   NO PAST SURGERIES      OB History   No obstetric history on file.      Home Medications    Prior to Admission medications   Medication Sig Start Date End Date Taking? Authorizing Provider  benzonatate (TESSALON) 100 MG capsule Take 2 capsules (200 mg total) by mouth every 8 (eight) hours. 10/20/21  Yes Becky Augusta, NP  ipratropium (ATROVENT) 0.06 % nasal spray Place 2 sprays into both nostrils 4 (four) times daily. 10/20/21  Yes Becky Augusta, NP  losartan (COZAAR) 25 MG tablet Take 25 mg by mouth daily. 05/14/19  Yes [provider]  promethazine-dextromethorphan (PROMETHAZINE-DM) 6.25-15 MG/5ML syrup Take 5 mLs by mouth 4 (four) times daily as needed. 10/20/21  Yes Becky Augusta, NP  naproxen (NAPROSYN) 375 MG tablet Take 1 tablet (375 mg total) by mouth 2 (two) times daily. 09/17/21   Raspet, Denny Peon K, PA-C  ondansetron (ZOFRAN-ODT) 4 MG disintegrating tablet Take 1 tablet (4 mg total) by mouth every 6 (six) hours as needed for nausea or vomiting. 05/26/21   Ward, Layla Maw, DO  famotidine (PEPCID) 20 MG tablet Take 1 tablet (20 mg total) by mouth daily. 11/17/18 06/19/19  Phineas Semen, MD  sucralfate (CARAFATE) 1 g tablet Take 1 tablet (1 g total) by mouth 4 (four) times daily. 11/17/18 06/19/19  Phineas Semen, MD    Family History Family History  Problem Relation Age of  Onset   Other Mother        unknown medical history   Heart failure Father    Hypertension Father     Social History Social History   Tobacco Use   Smoking status: Every Day    Packs/day: 0.50    Types: Cigarettes   Smokeless tobacco: Never  Vaping Use   Vaping Use: Never used  Substance Use Topics   Alcohol use: No   Drug use: No     Allergies   Atorvastatin, Biaxin [clarithromycin], Codeine, Levaquin [levofloxacin], Lidocaine, Lisinopril, and Sulfa  antibiotics   Review of Systems Review of Systems  Constitutional:  Negative for fever.  HENT:  Positive for congestion, rhinorrhea and sinus pressure. Negative for ear pain and sore throat.   Respiratory:  Positive for cough and wheezing. Negative for shortness of breath.      Physical Exam Triage Vital Signs ED Triage Vitals  Enc Vitals Group     BP 10/20/21 1340 (!) 143/91     Pulse Rate 10/20/21 1340 82     Resp 10/20/21 1340 14     Temp 10/20/21 1340 98.3 F (36.8 C)     Temp Source 10/20/21 1340 Oral     SpO2 10/20/21 1340 97 %     Weight 10/20/21 1337 227 lb (103 kg)     Height 10/20/21 1337 5\' 7"  (1.702 m)     Head Circumference --      Peak Flow --      Pain Score 10/20/21 1337 4     Pain Loc --      Pain Edu? --      Excl. in GC? --    No data found.  Updated Vital Signs BP (!) 143/91 (BP Location: Left Arm)   Pulse 82   Temp 98.3 F (36.8 C) (Oral)   Resp 14   Ht 5\' 7"  (1.702 m)   Wt 227 lb (103 kg)   LMP 09/13/2016   SpO2 97%   BMI 35.55 kg/m   Visual Acuity Right Eye Distance:   Left Eye Distance:   Bilateral Distance:    Right Eye Near:   Left Eye Near:    Bilateral Near:     Physical Exam Vitals and nursing note reviewed.  Constitutional:      Appearance: Normal appearance. She is not ill-appearing.  HENT:     Head: Normocephalic and atraumatic.     Right Ear: Tympanic membrane, ear canal and external ear normal. There is no impacted cerumen.     Left Ear: Tympanic membrane, ear canal and external ear normal. There is no impacted cerumen.     Nose: Congestion and rhinorrhea present.     Mouth/Throat:     Mouth: Mucous membranes are moist.     Pharynx: Oropharynx is clear. No oropharyngeal exudate or posterior oropharyngeal erythema.  Cardiovascular:     Rate and Rhythm: Normal rate and regular rhythm.     Pulses: Normal pulses.     Heart sounds: Normal heart sounds. No murmur heard.    No friction rub. No gallop.  Pulmonary:      Effort: Pulmonary effort is normal.     Breath sounds: Normal breath sounds. No wheezing, rhonchi or rales.  Musculoskeletal:     Cervical back: Normal range of motion and neck supple.  Lymphadenopathy:     Cervical: No cervical adenopathy.  Skin:    General: Skin is warm and dry.     Capillary Refill:  Capillary refill takes less than 2 seconds.     Findings: No erythema or rash.  Neurological:     General: No focal deficit present.     Mental Status: She is alert and oriented to person, place, and time.  Psychiatric:        Mood and Affect: Mood normal.        Behavior: Behavior normal.        Thought Content: Thought content normal.        Judgment: Judgment normal.      UC Treatments / Results  Labs (all labs ordered are listed, but only abnormal results are displayed) Labs Reviewed  SARS CORONAVIRUS 2 BY RT PCR    EKG   Radiology No results found.  Procedures Procedures (including critical care time)  Medications Ordered in UC Medications - No data to display  Initial Impression / Assessment and Plan / UC Course  I have reviewed the triage vital signs and the nursing notes.  Pertinent labs & imaging results that were available during my care of the patient were reviewed by me and considered in my medical decision making (see chart for details).   Patient is a nontoxic-appearing 61 year old female here for evaluation Rester complaints as outlined HPI above.  On exam patient has pearly-gray tympanic membranes bilaterally with normal light reflex and clear external auditory canals.  Nasal mucosa is erythematous edematous with clear discharge in both nares.  Oropharyngeal exam reveals clear postnasal drip but no erythema or injection noted.  No cervical lymphadenopathy on exam.  Cardiopulmonary exam reveals clear lung sounds in all fields.  Patient exam is consistent with an upper respiratory faction.  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  I will order a COVID PCR.  COVID PCR is negative.  I will discharge patient home with a diagnosis of viral URI with a cough.  I will prescribe her Atrovent nasal spray, Tessalon Perles, and Promethazine DM cough syrup to help with her symptoms.  Return precautions reviewed.   Final Clinical Impressions(s) / UC Diagnoses   Final diagnoses:  Viral URI with cough     Discharge Instructions      Your COVID test today was negative but your exam is consistent with a viral respiratory infection.  Use the Atrovent nasal spray, 2 squirts in each nostril every 6 hours, as needed for runny nose and postnasal drip.  Use the Tessalon Perles every 8 hours during the day.  Take them with a small sip of water.  They may give you some numbness to the base of your tongue or a metallic taste in your mouth, this is normal.  Use the Promethazine DM cough syrup at bedtime for cough and congestion.  It will make you drowsy so do not take it during the day.  Return for reevaluation or see your primary care provider for any new or worsening symptoms.      ED Prescriptions     Medication Sig Dispense Auth. Provider   benzonatate (TESSALON) 100 MG capsule Take 2 capsules (200 mg total) by mouth every 8 (eight) hours. 21 capsule Margarette Canada, NP   ipratropium (ATROVENT) 0.06 % nasal spray Place 2 sprays into both nostrils 4 (four) times daily. 15 mL Margarette Canada, NP   promethazine-dextromethorphan (PROMETHAZINE-DM) 6.25-15 MG/5ML syrup Take 5 mLs by mouth 4 (four) times daily as needed. 118 mL Margarette Canada, NP      PDMP not reviewed  this encounter.   Becky Augusta, NP 10/20/21 1429

## 2021-10-21 ENCOUNTER — Telehealth: Payer: Self-pay | Admitting: Emergency Medicine

## 2021-10-21 MED ORDER — ALBUTEROL SULFATE HFA 108 (90 BASE) MCG/ACT IN AERS: 2.0000 | INHALATION_SPRAY | RESPIRATORY_TRACT | 0 refills | Status: AC | PRN

## 2021-10-21 NOTE — Telephone Encounter (Signed)
Patient has a history of asthma and she is here requesting an albuterol inhaler as she does not have one at present.  Prescription sent to the pharmacy.

## 2022-04-05 ENCOUNTER — Ambulatory Visit
Admission: EM | Admit: 2022-04-05 | Discharge: 2022-04-05 | Disposition: A | Discharge status: Home / Self Care | Payer: 59

## 2022-04-05 DIAGNOSIS — M7989 Other specified soft tissue disorders: Secondary | ICD-10-CM

## 2022-04-05 DIAGNOSIS — M79662 Pain in left lower leg: Secondary | ICD-10-CM

## 2022-04-05 NOTE — ED Triage Notes (Signed)
Pt states she is having pain and swelling and bruising on back of left calf that started Saturday. Pt states she was walking Saturday and heard a pop sound and has been having pain since, no injury or recent fall. Taking ibuprofen.

## 2022-04-05 NOTE — ED Provider Notes (Addendum)
[facility]    CSN: [id number] Arrival date & time: 04/05/22  1436      History   Chief Complaint Chief Complaint  Patient presents with   Leg Pain    HPI Karen Reed is a 62 y.o. female presents for evaluation of calf pain.  Patient reports 6 days ago she was reaching above her bed to grab something standing on her tippy toes.  States she felt a sharp pain in her mid left calf and heard a "pop".  She reports since then she has developed swelling and bruising to the area and has significant pain with weightbearing.  Does states area feels warm.  No numbness or tingling.  Denies any history of DVT or PE.  She is not on any estrogen/hormonal treatments.  No chest pain or SOB. She has been elevating and icing without any improvement.  No other concerns at this time.   Leg Pain   Past Medical History:  Diagnosis Date   Asthma    Bronchitis induced   H/O seasonal allergies    Heart palpitations    Hypertension    Sjogren's disease ([place])     Patient Active Problem List   Diagnosis Date Noted   Anxiety 08/03/2020   OSA (obstructive sleep apnea) 08/03/2020   Partial Achilles tendon tear, left, initial encounter 08/03/2020   Acute stress reaction 09/08/2019   Adjustment reaction 09/08/2019   Mixed hyperlipidemia 08/13/2019   Sciatica 08/13/2019   Neuropathic pain 08/09/2019   Hx of trauma 05/05/2018   Palpitations 02/12/2018   Abnormal CT scan, neck 08/20/2017   Polyarthralgia 07/03/2017   Submandibular gland swelling 07/03/2017   Prediabetes 06/18/2017   Essential hypertension 06/11/2017   Sjogren's syndrome with keratoconjunctivitis sicca ([place]) 06/11/2017   Lymphadenopathy of head and neck region 03/05/2016   Chest pain, unspecified 07/11/2014   Bronchitis due to tobacco use 04/01/2013   Obesity (BMI 30.0-34.9) 04/01/2013   Osteoarthrosis involving lower leg 04/01/2013   Simple chronic bronchitis ([place]) 04/01/2013   Tobacco dependence 04/01/2013    Tobacco use disorder 04/01/2013   Venous insufficiency (chronic) (peripheral) 04/01/2013   MDD (major depressive disorder), recurrent episode, severe ([place]) 05/04/2012   Insomnia 04/27/2012   Major depressive disorder, single episode 04/27/2012   Enthesopathy of knee 03/27/2010    Past Surgical History:  Procedure Laterality Date   NO PAST SURGERIES      OB History   No obstetric history on file.      Home Medications    Prior to Admission medications   Medication Sig Start Date End Date Taking? Authorizing Provider  losartan (COZAAR) 25 MG tablet Take 25 mg by mouth daily. 05/14/19  Yes [provider]  albuterol (VENTOLIN HFA) 108 (90 Base) MCG/ACT inhaler Inhale 2 puffs into the lungs every 4 (four) hours as needed. 10/21/21   Margarette Canada, NP  benzonatate (TESSALON) 100 MG capsule Take 2 capsules (200 mg total) by mouth every 8 (eight) hours. 10/20/21   Margarette Canada, NP  ipratropium (ATROVENT) 0.06 % nasal spray Place 2 sprays into both nostrils 4 (four) times daily. 10/20/21   Margarette Canada, NP  naproxen (NAPROSYN) 375 MG tablet Take 1 tablet (375 mg total) by mouth 2 (two) times daily. 09/17/21   Raspet, Derry Skill, PA-C  ondansetron (ZOFRAN-ODT) 4 MG disintegrating tablet Take 1 tablet (4 mg total) by mouth every 6 (six) hours as needed for nausea or vomiting. 05/26/21   Ward, Delice Bison, DO  promethazine-dextromethorphan (PROMETHAZINE-DM) 6.25-15  MG/5ML syrup Take 5 mLs by mouth 4 (four) times daily as needed. 10/20/21   Margarette Canada, NP  famotidine (PEPCID) 20 MG tablet Take 1 tablet (20 mg total) by mouth daily. 11/17/18 06/19/19  Nance Pear, MD  sucralfate (CARAFATE) 1 g tablet Take 1 tablet (1 g total) by mouth 4 (four) times daily. 11/17/18 06/19/19  Nance Pear, MD    Family History Family History  Problem Relation Age of Onset   Other Mother        unknown medical history   Heart failure Father    Hypertension Father     Social History Social History    Tobacco Use   Smoking status: Every Day    Packs/day: .5    Types: Cigarettes   Smokeless tobacco: Never  Vaping Use   Vaping Use: Never used  Substance Use Topics   Alcohol use: No   Drug use: No     Allergies   Atorvastatin, Biaxin [clarithromycin], Codeine, Levaquin [levofloxacin], Lidocaine, Lisinopril, and Sulfa antibiotics   Review of Systems Review of Systems  Skin:        Left calf pain      Physical Exam Triage Vital Signs ED Triage Vitals  Enc Vitals Group     BP 04/05/22 1455 (!) 171/98     Pulse Rate 04/05/22 1455 90     Resp 04/05/22 1455 17     Temp 04/05/22 1455 98.5 F (36.9 C)     Temp Source 04/05/22 1455 Oral     SpO2 04/05/22 1455 96 %     Weight --      Height --      Head Circumference --      Peak Flow --      Pain Score 04/05/22 1454 7     Pain Loc --      Pain Edu? --      Excl. in [place]? --    No data found.  Updated Vital Signs BP (!) 171/98 (BP Location: Right Arm)   Pulse 90   Temp 98.5 F (36.9 C) (Oral)   Resp 17   LMP 09/13/2016   SpO2 96%   Visual Acuity Right Eye Distance:   Left Eye Distance:   Bilateral Distance:    Right Eye Near:   Left Eye Near:    Bilateral Near:     Physical Exam Vitals and nursing note reviewed.  Constitutional:      Appearance: Normal appearance.  HENT:     Head: Normocephalic and atraumatic.  Eyes:     Pupils: Pupils are equal, round, and reactive to light.  Cardiovascular:     Rate and Rhythm: Normal rate.  Pulmonary:     Effort: Pulmonary effort is normal.  Musculoskeletal:     Left lower leg: Swelling and tenderness present. 1+ Pitting Edema present.       Legs:     Comments: +1 edema from left ankle to left knee.  Moderate ecchymosis to posterior calf with tenderness along the mid posterior calf.  Mild warmth without erythema.  Positive Homans' sign. Left calf measures 49cm, right 48 cm  Skin:    General: Skin is warm and dry.  Neurological:     General: No focal  deficit present.     Mental Status: She is alert and oriented to person, place, and time.  Psychiatric:        Mood and Affect: Mood normal.        Behavior: Behavior  normal.    Clinical feature Score     Active cancer (treatment ongoing or within the previous six months or palliative) 0  Paralysis, paresis, or recent plaster immobilization of the lower extremities 0  Recently bedridden for more than three days or major surgery, within four weeks                                                    0  Localized tenderness along the distribution of the deep venous system 1  Entire leg swollen 0  Calf swelling by more than 3 cm when compared to the asymptomatic leg (measured below tibial tuberosity) 0  Pitting edema (greater in the symptomatic leg) 1  Collateral superficial veins (nonvaricose) 1  Alternative diagnosis as likely or more likely than that of deep venous thrombosis -2                                                                                                                                                             Total Score 3     Interpretation    High probability  3 or greater  Moderate probability 1 or 2  Low probability 0 or less  Modification   This clinical model has been modified to take one other clinical feature into account: a previously documented deep vein thrombosis (DVT) is given the score of 1. Using this modified scoring system, DVT is either likely or unlikely, as follows:  DVT likely 2 or greater   DVT unlikely 1 or less     UC Treatments / Results  Labs (all labs ordered are listed, but only abnormal results are displayed) Labs Reviewed - No data to display  EKG   Radiology No results found.  Procedures Procedures (including critical care time)  Medications Ordered in UC Medications - No data to display  Initial Impression / Assessment and Plan / UC Course  I have reviewed the triage vital signs and the nursing notes.  Pertinent  labs & imaging results that were available during my care of the patient were reviewed by me and considered in my medical decision making (see chart for details).     Reviewed exam and symptoms with patient.  Discussed limitations and abilities of urgent care Discussed given presentation, exam, and well's score of 3 concern for DVT.  Advised patient to go to the emergency room for further evaluation.  She is agreement plan will go POV to the ER. Final Clinical Impressions(s) / UC Diagnoses   Final diagnoses:  Pain of left calf  Calf swelling     Discharge Instructions      Please  go to the ER for further evaluation and treatment of your symptoms      ED Prescriptions   None    PDMP not reviewed this encounter.   Melynda Ripple, NP 04/05/22 1515    Melynda Ripple, NP 04/05/22 [phone]

## 2022-04-05 NOTE — ED Notes (Signed)
Patient is being discharged from the Urgent Care and sent to the Emergency Department via [facility] . Per provider Leveda Anna, patient is in need of higher level of care due to left leg swelling with bruising and possible blood clot rule out. Patient is aware and verbalizes understanding of plan of care.  Vitals:   04/05/22 1455  BP: (!) 171/98  Pulse: 90  Resp: 17  Temp: 98.5 F (36.9 C)  SpO2: 96%

## 2022-04-05 NOTE — Discharge Instructions (Signed)
Please go to the ER for further evaluation and treatment of your symptoms

## 2023-01-15 ENCOUNTER — Emergency Department: Payer: 59

## 2023-01-15 ENCOUNTER — Emergency Department
Admission: EM | Admit: 2023-01-15 | Discharge: 2023-01-15 | Disposition: A | Discharge status: Home / Self Care | Payer: 59 | Attending: Emergency Medicine | Admitting: Emergency Medicine

## 2023-01-15 DIAGNOSIS — K8050 Calculus of bile duct without cholangitis or cholecystitis without obstruction: Secondary | ICD-10-CM

## 2023-01-15 DIAGNOSIS — I10 Essential (primary) hypertension: Secondary | ICD-10-CM | POA: Insufficient documentation

## 2023-01-15 DIAGNOSIS — K8070 Calculus of gallbladder and bile duct without cholecystitis without obstruction: Secondary | ICD-10-CM | POA: Insufficient documentation

## 2023-01-15 DIAGNOSIS — R1011 Right upper quadrant pain: Secondary | ICD-10-CM | POA: Diagnosis present

## 2023-01-15 LAB — HEPATIC FUNCTION PANEL
ALT: 123 U/L — ABNORMAL HIGH (ref 0–44)
AST: 223 U/L — ABNORMAL HIGH (ref 15–41)
Albumin: 3.7 g/dL (ref 3.5–5.0)
Alkaline Phosphatase: 89 U/L (ref 38–126)
Bilirubin, Direct: 0.4 mg/dL — ABNORMAL HIGH (ref 0.0–0.2)
Indirect Bilirubin: 0.6 mg/dL (ref 0.3–0.9)
Total Bilirubin: 1 mg/dL (ref 0.0–1.2)
Total Protein: 7.5 g/dL (ref 6.5–8.1)

## 2023-01-15 LAB — CBC
HCT: 36.9 % (ref 36.0–46.0)
Hemoglobin: 11.8 g/dL — ABNORMAL LOW (ref 12.0–15.0)
MCH: 29.5 pg (ref 26.0–34.0)
MCHC: 32 g/dL (ref 30.0–36.0)
MCV: 92.3 fL (ref 80.0–100.0)
Platelets: 388 10*3/uL (ref 150–400)
RBC: 4 MIL/uL (ref 3.87–5.11)
RDW: 16.1 % — ABNORMAL HIGH (ref 11.5–15.5)
WBC: 6.9 10*3/uL (ref 4.0–10.5)
nRBC: 0 % (ref 0.0–0.2)

## 2023-01-15 LAB — COMPREHENSIVE METABOLIC PANEL
ALT: 122 U/L — ABNORMAL HIGH (ref 0–44)
AST: 220 U/L — ABNORMAL HIGH (ref 15–41)
Albumin: 3.7 g/dL (ref 3.5–5.0)
Alkaline Phosphatase: 88 U/L (ref 38–126)
Anion gap: 11 (ref 5–15)
BUN: 13 mg/dL (ref 6–20)
CO2: 23 mmol/L (ref 22–32)
Calcium: 8.9 mg/dL (ref 8.9–10.3)
Chloride: 105 mmol/L (ref 98–111)
Creatinine, Ser: 0.63 mg/dL (ref 0.44–1.00)
GFR, Estimated: >60 mL/min (ref >60)
Glucose, Bld: 102 mg/dL — ABNORMAL HIGH (ref 70–99)
Potassium: 4.3 mmol/L (ref 3.5–5.1)
Sodium: 139 mmol/L (ref 135–145)
Total Bilirubin: 1.1 mg/dL (ref 0.0–1.2)
Total Protein: 7.5 g/dL (ref 6.5–8.1)

## 2023-01-15 LAB — URINALYSIS, ROUTINE W REFLEX MICROSCOPIC
Bilirubin Urine: NEGATIVE
Glucose, UA: NEGATIVE mg/dL
Hgb urine dipstick: NEGATIVE
Ketones, ur: NEGATIVE mg/dL
Leukocytes,Ua: NEGATIVE
Nitrite: NEGATIVE
Protein, ur: NEGATIVE mg/dL
Specific Gravity, Urine: 1.019 (ref 1.005–1.030)
pH: 6 (ref 5.0–8.0)

## 2023-01-15 LAB — LIPASE, BLOOD: Lipase: 112 U/L — ABNORMAL HIGH (ref 11–51)

## 2023-01-15 MED ORDER — ONDANSETRON HCL 4 MG/2ML IJ SOLN: 4.0000 mg | Freq: Once | INTRAMUSCULAR | Status: DC

## 2023-01-15 MED ORDER — ONDANSETRON 4 MG PO TBDP
4.0000 mg | ORAL_TABLET | Freq: Three times a day (TID) | ORAL | 0 refills | Status: DC | PRN
Start: 2023-01-15 — End: 2023-11-12

## 2023-01-15 MED ORDER — GADOBUTROL 1 MMOL/ML IV SOLN
9.0000 mL | Freq: Once | INTRAVENOUS | Status: AC | PRN
  Administered 2023-01-15: 9 mL via INTRAVENOUS

## 2023-01-15 MED ORDER — KETOROLAC TROMETHAMINE 30 MG/ML IJ SOLN
15.0000 mg | Freq: Once | INTRAMUSCULAR | Status: AC
  Administered 2023-01-15: 15 mg via INTRAVENOUS
  Filled 2023-01-15: qty 1

## 2023-01-15 MED ORDER — MORPHINE SULFATE (PF) 4 MG/ML IV SOLN: 4.0000 mg | Freq: Once | INTRAVENOUS | Status: DC

## 2023-01-15 MED ORDER — ACETAMINOPHEN 500 MG PO TABS
1000.0000 mg | ORAL_TABLET | Freq: Once | ORAL | Status: AC
  Administered 2023-01-15: 1000 mg via ORAL
  Filled 2023-01-15: qty 2

## 2023-01-15 MED ORDER — LORAZEPAM 1 MG PO TABS: 1.0000 mg | ORAL_TABLET | ORAL | Status: DC | PRN

## 2023-01-15 NOTE — ED Notes (Signed)
 Pt has had ongoing pain to whole upper abdomen that is dull/pressure/constant. Has gallstones. Pain began again today while in church around 2pm. Pt states pain is returning. Skin dry, respirations unlabored.

## 2023-01-15 NOTE — ED Triage Notes (Signed)
 Pt comes with c/o belly pain on right sided since 3am. Pt states issues with gallbladder and suppose to get it removed in Feb. Pt states nausea and vomiting.

## 2023-01-15 NOTE — ED Triage Notes (Signed)
 First nurse note: pt to ED [facility] from home for abd pain started this am. +n/v Scheduled to have gallbladder removed fentanyl PTA, 4 mg zofran PTA

## 2023-01-15 NOTE — ED Notes (Signed)
 Ultrasound at bedside

## 2023-01-15 NOTE — ED Provider Notes (Signed)
 [facility] Provider Note    Event Date/Time   First MD Initiated Contact with Patient 01/15/23 1637     (approximate)   History   Chief Complaint Abdominal Pain   HPI  Karen Reed is a 63 y.o. female with past medical history of hypertension, Sjogren's disease, and chronic bronchitis who presents to the ED complaining of abdominal pain.  Patient reports that she has been dealing with increasing pain in the right upper quadrant of her abdomen radiating like a band across both sides and into her back.  Pain has been present constantly since onset and associated with nausea, however she denies any vomiting or diarrhea.  She has had similar episodes of pain in the past related to her gallbladder, states she has surgery for her gallbladder scheduled in 1 month.  She denies any fevers and has not had any dysuria or flank pain.     Physical Exam   Triage Vital Signs: ED Triage Vitals  Encounter Vitals Group     BP 01/15/23 1458 (!) 168/86     Systolic BP Percentile --      Diastolic BP Percentile --      Pulse Rate 01/15/23 1458 (!) 51     Resp 01/15/23 1458 16     Temp 01/15/23 1458 97.9 F (36.6 C)     Temp Source 01/15/23 1458 Oral     SpO2 01/15/23 1458 97 %     Weight 01/15/23 1458 210 lb (95.3 kg)     Height 01/15/23 1458 5' 7 (1.702 m)     Head Circumference --      Peak Flow --      Pain Score 01/15/23 1500 7     Pain Loc --      Pain Education --      Exclude from Growth Chart --     Most recent vital signs: Vitals:   01/15/23 2230 01/15/23 2312  BP: (!) 147/70   Pulse: (!) 58   Resp:    Temp:  98 F (36.7 C)  SpO2: 96%     Constitutional: Alert and oriented. Eyes: Conjunctivae are normal. Head: Atraumatic. Nose: No congestion/rhinnorhea. Mouth/Throat: Mucous membranes are moist.  Cardiovascular: Normal rate, regular rhythm. Grossly normal heart sounds.  2+ radial pulses bilaterally. Respiratory: Normal respiratory effort.   No retractions. Lungs CTAB. Gastrointestinal: Soft and tender to palpation in the right upper quadrant with no rebound or guarding. No distention. Musculoskeletal: No lower extremity tenderness nor edema.  Neurologic:  Normal speech and language. No gross focal neurologic deficits are appreciated.    ED Results / Procedures / Treatments   Labs (all labs ordered are listed, but only abnormal results are displayed) Labs Reviewed  LIPASE, BLOOD - Abnormal; Notable for the following components:      Result Value   Lipase 112 (*)    All other components within normal limits  COMPREHENSIVE METABOLIC PANEL - Abnormal; Notable for the following components:   Glucose, Bld 102 (*)    AST 220 (*)    ALT 122 (*)    All other components within normal limits  CBC - Abnormal; Notable for the following components:   Hemoglobin 11.8 (*)    RDW 16.1 (*)    All other components within normal limits  URINALYSIS, ROUTINE W REFLEX MICROSCOPIC - Abnormal; Notable for the following components:   Color, Urine YELLOW (*)    APPearance CLEAR (*)    All other components  within normal limits  HEPATIC FUNCTION PANEL - Abnormal; Notable for the following components:   AST 223 (*)    ALT 123 (*)    Bilirubin, Direct 0.4 (*)    All other components within normal limits  HEPATITIS PANEL, ACUTE   RADIOLOGY Right upper quadrant ultrasound reviewed and interpreted by me with cholelithiasis, no wall thickening or pericholecystic fluid noted.  PROCEDURES:  Critical Care performed: No  Procedures   MEDICATIONS ORDERED IN ED: Medications  acetaminophen  (TYLENOL ) tablet 1,000 mg (1,000 mg Oral Given 01/15/23 1732)  gadobutrol  (GADAVIST ) 1 MMOL/ML injection 9 mL (9 mLs Intravenous Contrast Given 01/15/23 2026)  ketorolac  (TORADOL ) 30 MG/ML injection 15 mg (15 mg Intravenous Given 01/15/23 2157)     IMPRESSION / MDM / ASSESSMENT AND PLAN / ED COURSE  I reviewed the triage vital signs and the nursing notes.                               63 y.o. female with past medical history of hypertension, Sjogren's disease, and chronic bronchitis who presents to the ED complaining of constant right upper quadrant abdominal pain with nausea since 3:00 this morning.  Patient's presentation is most consistent with acute presentation with potential threat to life or bodily function.  Differential diagnosis includes, but is not limited to, cholecystitis, biliary colic, pancreatitis, hepatitis, choledocholithiasis, anemia, electrolyte abnormality, AKI.  Patient nontoxic-appearing and in no acute distress, vital signs are unremarkable.  Patient's abdomen is soft but she does have significant tenderness in the right upper quadrant, reports known gallstones and we will further assess with right upper quadrant ultrasound.  Labs show no significant anemia or leukocytosis, she does have transaminitis and mild lipase elevation, however bilirubin within normal limits.  No significant electrolyte abnormality or AKI noted, urinalysis unremarkable.  Patient declined IV morphine , will treat with Tylenol  and IV Zofran .  Right upper quadrant ultrasound shows gallstones with no findings concerning for cholecystitis, patient's CBD measured at 6 mm.  Findings discussed with Dr. Onita of GI, who recommends proceeding with MRCP to rule out choledocholithiasis.  MRCP performed and negative for choledocholithiasis, does show some pericholecystic fluid but no discrete findings of cholecystitis.  On reassessment, patient reports pain has resolved, presentation seems consistent with biliary colic.  She is appropriate for discharge home with outpatient follow-up, was counseled to follow-up with her surgeon and [facility] and to return to the ED for new or worsening symptoms.  Patient offered pain medication but declines, patient agrees with plan.     FINAL CLINICAL IMPRESSION(S) / ED DIAGNOSES   Final diagnoses:  Biliary colic     Rx / DC  Orders   ED Discharge Orders          Ordered    ondansetron  (ZOFRAN -ODT) 4 MG disintegrating tablet  Every 8 hours PRN        01/15/23 2338             Note:  This document was prepared using Dragon voice recognition software and may include unintentional dictation errors.   Willo Dunnings, MD 01/15/23 [id number]

## 2023-01-16 LAB — HEPATITIS PANEL, ACUTE
HCV Ab: NONREACTIVE
Hep A IgM: NONREACTIVE
Hep B C IgM: NONREACTIVE
Hepatitis B Surface Ag: NONREACTIVE

## 2023-03-27 ENCOUNTER — Ambulatory Visit
Admission: EM | Admit: 2023-03-27 | Discharge: 2023-03-27 | Disposition: A | Discharge status: Home / Self Care | Attending: Family Medicine | Admitting: Family Medicine

## 2023-03-27 ENCOUNTER — Ambulatory Visit (INDEPENDENT_AMBULATORY_CARE_PROVIDER_SITE_OTHER)

## 2023-03-27 ENCOUNTER — Encounter: Payer: Self-pay | Admitting: Emergency Medicine

## 2023-03-27 DIAGNOSIS — R051 Acute cough: Secondary | ICD-10-CM

## 2023-03-27 DIAGNOSIS — Z2089 Contact with and (suspected) exposure to other communicable diseases: Secondary | ICD-10-CM | POA: Diagnosis not present

## 2023-03-27 DIAGNOSIS — J0100 Acute maxillary sinusitis, unspecified: Secondary | ICD-10-CM | POA: Diagnosis not present

## 2023-03-27 MED ORDER — AMOXICILLIN-POT CLAVULANATE 875-125 MG PO TABS: 1.0000 | ORAL_TABLET | Freq: Two times a day (BID) | ORAL | 0 refills | Status: DC

## 2023-03-27 MED ORDER — BENZONATATE 200 MG PO CAPS: 200.0000 mg | ORAL_CAPSULE | Freq: Three times a day (TID) | ORAL | 0 refills | Status: DC | PRN

## 2023-03-27 MED ORDER — FLUTICASONE PROPIONATE 50 MCG/ACT NA SUSP: 1.0000 | Freq: Every day | NASAL | 0 refills | Status: AC

## 2023-03-27 NOTE — Discharge Instructions (Signed)
 Start Augmentin twice daily for 7 days.  You may take Tessalon 3 times a day as needed for your cough.  Flonase nasal spray daily.  Lots of rest and fluids.  Nasal rinses as tolerated.  Please follow-up with your PCP if your symptoms do not improve.  Please go to the ER if you develop any worsening symptoms.  Hope you feel better soon!

## 2023-03-27 NOTE — ED Provider Notes (Addendum)
 [facility]    CSN: [id number] Arrival date & time: 03/27/23  0856      History   Chief Complaint Chief Complaint  Patient presents with   Cough   sneezing    HPI Karen Reed is a 63 y.o. female  presents for evaluation of URI symptoms for 10 days. Patient reports associated symptoms of cough, congestion with sinus pressure/pain and fever of 100 degrees that began last night. Denies N/V/D, sore throat, ear pain, body aches. Patient does  have a hx of asthma.  Does endorse some shortness of breath but does not have an inhaler.  Patient does have a history of smoking.   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  Pt has taken over-the-counter cough medicine for symptoms. Pt has no other concerns at this time.    Cough Associated symptoms: shortness of breath     Past Medical History:  Diagnosis Date   Asthma    Bronchitis induced   H/O seasonal allergies    Heart palpitations    Hypertension    Sjogren's disease (HCC)     Patient Active Problem List   Diagnosis Date Noted   Anxiety 08/03/2020   OSA (obstructive sleep apnea) 08/03/2020   Partial Achilles tendon tear, left, initial encounter 08/03/2020   Acute stress reaction 09/08/2019   Adjustment reaction 09/08/2019   Mixed hyperlipidemia 08/13/2019   Sciatica 08/13/2019   Neuropathic pain 08/09/2019   Hx of trauma 05/05/2018   Palpitations 02/12/2018   Abnormal CT scan, neck 08/20/2017   Polyarthralgia 07/03/2017   Submandibular gland swelling 07/03/2017   Prediabetes 06/18/2017   Essential hypertension 06/11/2017   Sjogren's syndrome with keratoconjunctivitis sicca (HCC) 06/11/2017   Lymphadenopathy of head and neck region 03/05/2016   Chest pain, unspecified 07/11/2014   Bronchitis due to tobacco use 04/01/2013   Obesity (BMI 30.0-34.9) 04/01/2013   Osteoarthrosis involving lower leg 04/01/2013   Simple chronic bronchitis (HCC) 04/01/2013   Tobacco  dependence 04/01/2013   Tobacco use disorder 04/01/2013   Venous insufficiency (chronic) (peripheral) 04/01/2013   MDD (major depressive disorder), recurrent episode, severe (HCC) 05/04/2012   Insomnia 04/27/2012   Major depressive disorder, single episode 04/27/2012   Enthesopathy of knee 03/27/2010    Past Surgical History:  Procedure Laterality Date   CHOLECYSTECTOMY      OB History   No obstetric history on file.      Home Medications    Prior to Admission medications   Medication Sig Start Date End Date Taking? Authorizing Provider  amoxicillin-clavulanate (AUGMENTIN) 875-125 MG tablet Take 1 tablet by mouth every 12 (twelve) hours. 03/27/23  Yes Radford Pax, NP  benzonatate (TESSALON) 200 MG capsule Take 1 capsule (200 mg total) by mouth 3 (three) times daily as needed. 03/27/23  Yes Radford Pax, NP  fluticasone (FLONASE) 50 MCG/ACT nasal spray Place 1 spray into both nostrils daily. 03/27/23  Yes Radford Pax, NP  albuterol (VENTOLIN HFA) 108 (90 Base) MCG/ACT inhaler Inhale 2 puffs into the lungs every 4 (four) hours as needed. 10/21/21   Becky Augusta, NP  ipratropium (ATROVENT) 0.06 % nasal spray Place 2 sprays into both nostrils 4 (four) times daily. 10/20/21   Becky Augusta, NP  losartan (COZAAR) 25 MG tablet Take 25 mg by mouth daily. 05/14/19   [provider]  naproxen (NAPROSYN) 375 MG tablet Take 1 tablet (375 mg total) by mouth 2 (two) times daily. 09/17/21  Raspet, Erin K, PA-C  ondansetron (ZOFRAN-ODT) 4 MG disintegrating tablet Take 1 tablet (4 mg total) by mouth every 8 (eight) hours as needed for nausea or vomiting. 01/15/23   Chesley Noon, MD  promethazine-dextromethorphan (PROMETHAZINE-DM) 6.25-15 MG/5ML syrup Take 5 mLs by mouth 4 (four) times daily as needed. 10/20/21   Becky Augusta, NP  famotidine (PEPCID) 20 MG tablet Take 1 tablet (20 mg total) by mouth daily. 11/17/18 06/19/19  Phineas Semen, MD  sucralfate (CARAFATE) 1 g tablet Take 1 tablet (1 g  total) by mouth 4 (four) times daily. 11/17/18 06/19/19  Phineas Semen, MD    Family History Family History  Problem Relation Age of Onset   Other Mother        unknown medical history   Heart failure Father    Hypertension Father     Social History Social History   Tobacco Use   Smoking status: Every Day    Current packs/day: 0.50    Types: Cigarettes   Smokeless tobacco: Never  Vaping Use   Vaping status: Never Used  Substance Use Topics   Alcohol use: No   Drug use: No     Allergies   Atorvastatin, Biaxin [clarithromycin], Codeine, Levaquin [levofloxacin], Lidocaine, Lisinopril, and Sulfa antibiotics   Review of Systems Review of Systems  HENT:  Positive for congestion, sinus pressure and sinus pain.   Respiratory:  Positive for cough and shortness of breath.      Physical Exam Triage Vital Signs ED Triage Vitals  Encounter Vitals Group     BP 03/27/23 0911 122/87     Systolic BP Percentile --      Diastolic BP Percentile --      Pulse Rate 03/27/23 0911 77     Resp 03/27/23 0911 18     Temp 03/27/23 0911 98.1 F (36.7 C)     Temp Source 03/27/23 0911 Oral     SpO2 03/27/23 0911 100 %     Weight --      Height --      Head Circumference --      Peak Flow --      Pain Score 03/27/23 0910 0     Pain Loc --      Pain Education --      Exclude from Growth Chart --    No data found.  Updated Vital Signs BP 122/87 (BP Location: Left Arm)   Pulse 77   Temp 98.1 F (36.7 C) (Oral)   Resp 18   LMP 09/13/2016   SpO2 100%   Visual Acuity Right Eye Distance:   Left Eye Distance:   Bilateral Distance:    Right Eye Near:   Left Eye Near:    Bilateral Near:     Physical Exam Vitals and nursing note reviewed.  Constitutional:      General: She is not in acute distress.    Appearance: She is well-developed. She is not ill-appearing.  HENT:     Head: Normocephalic and atraumatic.     Right Ear: Tympanic membrane and ear canal normal.     Left  Ear: Tympanic membrane and ear canal normal.     Nose: Congestion present.     Right Turbinates: Swollen and pale.     Left Turbinates: Swollen and pale.     Right Sinus: No maxillary sinus tenderness or frontal sinus tenderness.     Left Sinus: Maxillary sinus tenderness and frontal sinus tenderness present.     Mouth/Throat:  Mouth: Mucous membranes are moist.     Pharynx: Oropharynx is clear. Uvula midline. No oropharyngeal exudate or posterior oropharyngeal erythema.     Tonsils: No tonsillar exudate or tonsillar abscesses.  Eyes:     Conjunctiva/sclera: Conjunctivae normal.     Pupils: Pupils are equal, round, and reactive to light.  Cardiovascular:     Rate and Rhythm: Normal rate and regular rhythm.     Heart sounds: Normal heart sounds.  Pulmonary:     Effort: Pulmonary effort is normal.     Breath sounds: Normal breath sounds. No wheezing or rhonchi.  Musculoskeletal:     Cervical back: Normal range of motion and neck supple.  Lymphadenopathy:     Cervical: No cervical adenopathy.  Skin:    General: Skin is warm and dry.  Neurological:     General: No focal deficit present.     Mental Status: She is alert and oriented to person, place, and time.  Psychiatric:        Mood and Affect: Mood normal.        Behavior: Behavior normal.      UC Treatments / Results  Labs (all labs ordered are listed, but only abnormal results are displayed) Labs Reviewed - No data to display  Comprehensive Metabolic Panel Order: [id number] Component Ref Range & Units 6 d ago  Sodium 135 - 145 mmol/L 142  Potassium 3.4 - 4.8 mmol/L 3.9  Chloride 98 - 107 mmol/L 104  CO2 20.0 - 31.0 mmol/L 30.2  Anion Gap 5 - 14 mmol/L 8  BUN 9 - 23 mg/dL 7 Low   Creatinine 9.56 - 1.02 mg/dL 2.13  BUN/Creatinine Ratio 11  eGFR CKD-EPI (2021) Female >=60 mL/min/1.71m2 >90  Comment: eGFR calculated with CKD-EPI 2021 equation in accordance with [employer] and [employer]  of Nephrology Task Force recommendations.  Glucose 70 - 179 mg/dL 76  Calcium 8.7 - 08.6 mg/dL 9.9  Albumin 3.4 - 5.0 g/dL 3.8  Total Protein 5.7 - 8.2 g/dL 7.8  Total Bilirubin 0.3 - 1.2 mg/dL 0.4  AST <=57 U/L 20  ALT 10 - 49 U/L 22  Alkaline Phosphatase 46 - 116 U/L 79  Resulting Agency [facility]   Specimen Collected: 03/21/23 16:02   Performed by: [facility] Last Resulted: 03/21/23 17:22  Received From: [facility]  Result Received: 03/27/23 08:56    EKG   Radiology No results found.  Procedures Procedures (including critical care time)  Medications Ordered in UC Medications - No data to display  Initial Impression / Assessment and Plan / UC Course  I have reviewed the triage vital signs and the nursing notes.  Pertinent labs & imaging results that were available during my care of the patient were reviewed by me and considered in my medical decision making (see chart for details).     Reviewed exam and symptoms with patient.  No red flags.  Awaiting radiology overread of x-ray, will contact for any positive results.  Will start Augmentin for sinusitis.  Tessalon as needed cough and Flonase daily.  Nasal rinses as tolerated.  PCP follow-up if symptoms do not improve.  ER precautions reviewed and patient verbalized understanding. Final Clinical Impressions(s) / UC Diagnoses   Final diagnoses:  Exposure to pneumonia  Acute cough  Acute maxillary sinusitis, recurrence not specified     Discharge Instructions      Start Augmentin twice daily for 7 days.  You may take Tessalon 3 times  a day as needed for your cough.  Flonase nasal spray daily.  Lots of rest and fluids.  Nasal rinses as tolerated.  Please follow-up with your PCP if your symptoms do not improve.  Please go to the ER if you develop any worsening symptoms.  Hope you feel better soon!     ED Prescriptions     Medication Sig Dispense Auth. Provider    amoxicillin-clavulanate (AUGMENTIN) 875-125 MG tablet Take 1 tablet by mouth every 12 (twelve) hours. 14 tablet Radford Pax, NP   benzonatate (TESSALON) 200 MG capsule Take 1 capsule (200 mg total) by mouth 3 (three) times daily as needed. 20 capsule Radford Pax, NP   fluticasone (FLONASE) 50 MCG/ACT nasal spray Place 1 spray into both nostrils daily. 15.8 mL Radford Pax, NP      PDMP not reviewed this encounter.   Radford Pax, NP 03/27/23 4401    Radford Pax, NP 03/27/23 1000

## 2023-03-27 NOTE — ED Triage Notes (Signed)
 Pt presents with a cough, chest congestion and sneezing x 8-10 days.

## 2023-11-12 ENCOUNTER — Encounter: Payer: Self-pay | Admitting: Emergency Medicine

## 2023-11-12 ENCOUNTER — Ambulatory Visit
Admission: EM | Admit: 2023-11-12 | Discharge: 2023-11-12 | Disposition: A | Discharge status: Home / Self Care | Attending: Physician Assistant | Admitting: Physician Assistant

## 2023-11-12 DIAGNOSIS — R7303 Prediabetes: Secondary | ICD-10-CM | POA: Insufficient documentation

## 2023-11-12 DIAGNOSIS — I493 Ventricular premature depolarization: Secondary | ICD-10-CM | POA: Insufficient documentation

## 2023-11-12 DIAGNOSIS — R42 Dizziness and giddiness: Secondary | ICD-10-CM | POA: Insufficient documentation

## 2023-11-12 DIAGNOSIS — R002 Palpitations: Secondary | ICD-10-CM | POA: Diagnosis present

## 2023-11-12 DIAGNOSIS — N390 Urinary tract infection, site not specified: Secondary | ICD-10-CM | POA: Insufficient documentation

## 2023-11-12 LAB — URINALYSIS, W/ REFLEX TO CULTURE (INFECTION SUSPECTED)
Glucose, UA: NEGATIVE mg/dL
Hgb urine dipstick: NEGATIVE
Nitrite: NEGATIVE
Protein, ur: NEGATIVE mg/dL
Specific Gravity, Urine: 1.025 (ref 1.005–1.030)
pH: 6 (ref 5.0–8.0)

## 2023-11-12 LAB — GLUCOSE, CAPILLARY: Glucose-Capillary: 80 mg/dL (ref 70–99)

## 2023-11-12 MED ORDER — CEFDINIR 300 MG PO CAPS: 300.0000 mg | ORAL_CAPSULE | Freq: Two times a day (BID) | ORAL | 0 refills | Status: AC

## 2023-11-12 NOTE — ED Triage Notes (Signed)
 Pt presents with blurred vision and dizziness x 2 days. Pt has not taken her BP medication in 2 days due to her symptoms. Pt also has urinary frequency.

## 2023-11-12 NOTE — Discharge Instructions (Addendum)
-   Your blood pressure and other vital signs are all normal. - Your blood sugar is normal at 80. - Your EKG shows PVCs.  A lot of PVCs can make you feel dizzy or fatigued and like you could pass out especially if they are in a row.  As discussed, I am unable to keep you on a monitor to further evaluate that.  We discussed potentially going to the ER especially if symptoms are worsening or if they are not improving so that you can be on a monitor and have other labs performed. - The urine sample you left does look like a urinary tract infection which can also make you feel dizzy.  I sent an antibiotic to the pharmacy.  You should be starting to improve over the next 24 hours and definitely within the next 2 days.  If symptoms have not improved you certainly need to go to the ER. - If at any point you have chest pain/tightness, increased dizziness, fall, feel more faint, racing heart/fluttering in your chest does not go away within a few seconds, you have vomiting, numbness/tingling, weakness, leg swelling, severe headaches, worsening vision, excetra you should call 911 or have someone immediately take you to the ER.   UTI: Based on either symptoms or urinalysis, you may have a urinary tract infection. We will send the urine for culture and call with results in a few days. Begin antibiotics at this time. Your symptoms should be much improved over the next 2-3 days. Increase rest and fluid intake. If for some reason symptoms are worsening or not improving after a couple of days or the urine culture determines the antibiotics you are taking will not treat the infection, the antibiotics may be changed. Return or go to ER for fever, back pain, worsening urinary pain, discharge, increased blood in urine. May take Tylenol  or Motrin  OTC for pain relief or consider AZO if no contraindications

## 2023-11-12 NOTE — ED Provider Notes (Signed)
 [facility]    CSN: [id number] Arrival date & time: 11/12/23  9176      History   Chief Complaint Chief Complaint  Patient presents with   Dizziness   Blurred Vision    HPI Karen Reed is a 63 y.o. female with a history of anxiety, hypertension, hyperlipidemia, palpitations, Sjorgren's, OSA, tobacco abuse, chronic bronchitis, chronic venous insufficiency, depression, OA, and pre-diabetes presents for dizziness/lightheadedness and blurred vision x 2 days. States she has not taken her BP medication due to these symptoms. Reports feeling faint but has not passed out. Denies headaches, neck stiffness, numbness/tingling/weakness, chest pain, leg swelling, abdominal pain, cough, congestion, vomiting. Reports intermittent nausea and palpitations.   Patient also reports urinary frequency/urgency for a couple of days. Denies painful urinating, difficulty urinating, hematuria, flank pain. Believes she might have a UTI.   HPI  Past Medical History:  Diagnosis Date   Asthma    Bronchitis induced   H/O seasonal allergies    Heart palpitations    Hypertension    Sjogren's disease     Patient Active Problem List   Diagnosis Date Noted   Anxiety 08/03/2020   OSA (obstructive sleep apnea) 08/03/2020   Partial Achilles tendon tear, left, initial encounter 08/03/2020   Acute stress reaction 09/08/2019   Adjustment reaction 09/08/2019   Mixed hyperlipidemia 08/13/2019   Sciatica 08/13/2019   Neuropathic pain 08/09/2019   Hx of trauma 05/05/2018   Palpitations 02/12/2018   Abnormal CT scan, neck 08/20/2017   Polyarthralgia 07/03/2017   Submandibular gland swelling 07/03/2017   Prediabetes 06/18/2017   Essential hypertension 06/11/2017   Sjogren's syndrome with keratoconjunctivitis sicca 06/11/2017   Lymphadenopathy of head and neck region 03/05/2016   Chest pain, unspecified 07/11/2014   Bronchitis due to tobacco use 04/01/2013   Obesity (BMI 30.0-34.9) 04/01/2013    Osteoarthrosis involving lower leg 04/01/2013   Simple chronic bronchitis (HCC) 04/01/2013   Tobacco dependence 04/01/2013   Tobacco use disorder 04/01/2013   Venous insufficiency (chronic) (peripheral) 04/01/2013   MDD (major depressive disorder), recurrent episode, severe (HCC) 05/04/2012   Insomnia 04/27/2012   Major depressive disorder, single episode 04/27/2012   Enthesopathy of knee 03/27/2010    Past Surgical History:  Procedure Laterality Date   CHOLECYSTECTOMY      OB History   No obstetric history on file.      Home Medications    Prior to Admission medications   Medication Sig Start Date End Date Taking? Authorizing Provider  cefdinir  (OMNICEF ) 300 MG capsule Take 1 capsule (300 mg total) by mouth 2 (two) times daily for 7 days. 11/12/23 11/19/23 Yes Arvis Jolan NOVAK, PA-C  [employer] D3 25 MCG (1000 UT) capsule Take 1,000 Units by mouth daily. 08/15/23  Yes [provider]  fluticasone  (FLONASE ) 50 MCG/ACT nasal spray Place 1 spray into both nostrils daily. 03/27/23  Yes Mayer, Jodi R, NP  losartan (COZAAR) 25 MG tablet Take 25 mg by mouth daily. 05/14/19  Yes [provider]  albuterol  (VENTOLIN  HFA) 108 (90 Base) MCG/ACT inhaler Inhale 2 puffs into the lungs every 4 (four) hours as needed. 10/21/21   Bernardino Ditch, NP  cyanocobalamin (VITAMIN B12) 1000 MCG tablet Take 1,000 mcg by mouth daily.    [provider]  tirzepatide (ZEPBOUND) 10 MG/0.5ML Pen Inject 10 mg into the skin. 11/14/23 12/06/23  [provider]  famotidine  (PEPCID ) 20 MG tablet Take 1 tablet (20 mg total) by mouth daily. 11/17/18 06/19/19  Goodman, Graydon, MD  sucralfate  (CARAFATE ) 1 g tablet Take 1 tablet (1 g total) by mouth 4 (four) times daily. 11/17/18 06/19/19  Floy Roberts, MD    Family History Family History  Problem Relation Age of Onset   Other Mother        unknown medical history   Heart failure Father    Hypertension Father     Social History Social  History   Tobacco Use   Smoking status: Every Day    Current packs/day: 0.50    Types: Cigarettes   Smokeless tobacco: Never  Vaping Use   Vaping status: Never Used  Substance Use Topics   Alcohol use: No   Drug use: No     Allergies   Atorvastatin, Biaxin [clarithromycin], Codeine, Levaquin [levofloxacin], Lidocaine , Lisinopril, and Sulfa antibiotics   Review of Systems Review of Systems  Constitutional:  Negative for fatigue and fever.  HENT:  Negative for congestion, rhinorrhea and sore throat.   Eyes:  Positive for visual disturbance.  Respiratory:  Negative for cough, shortness of breath and wheezing.   Cardiovascular:  Positive for palpitations. Negative for chest pain and leg swelling.  Gastrointestinal:  Positive for nausea. Negative for abdominal pain, diarrhea and vomiting.  Genitourinary:  Positive for frequency and urgency. Negative for difficulty urinating, dysuria, flank pain and hematuria.  Musculoskeletal:  Negative for back pain, gait problem and myalgias.  Neurological:  Positive for dizziness and light-headedness. Negative for syncope, facial asymmetry, weakness, numbness and headaches.  Psychiatric/Behavioral:  Negative for confusion.      Physical Exam Triage Vital Signs ED Triage Vitals  Encounter Vitals Group     BP      Girls Systolic BP Percentile      Girls Diastolic BP Percentile      Boys Systolic BP Percentile      Boys Diastolic BP Percentile      Pulse      Resp      Temp      Temp src      SpO2      Weight      Height      Head Circumference      Peak Flow      Pain Score      Pain Loc      Pain Education      Exclude from Growth Chart    No data found.  Updated Vital Signs BP 137/89 (BP Location: Left Arm)   Pulse 79   Temp 98.2 F (36.8 C) (Oral)   Resp 16   Wt 189 lb (85.7 kg)   LMP 09/13/2016   SpO2 100%   BMI 29.60 kg/m    PREVOUS BP READINGS:    08/11/23: 122/78 05/28/23: 160/94 03/27/23: 122/87 03/03/23: 131/76   Physical Exam Vitals and nursing note reviewed.  Constitutional:      General: She is not in acute distress.    Appearance: Normal appearance. She is not ill-appearing or toxic-appearing.  HENT:     Head: Normocephalic and atraumatic.     Right Ear: Tympanic membrane, ear canal and external ear normal.     Left Ear: Tympanic membrane, ear canal and external ear normal.     Nose: Nose normal.     Mouth/Throat:     Mouth: Mucous membranes are moist.     Pharynx: Oropharynx is clear.  Eyes:     General: No scleral icterus.       Right eye: No discharge.  Left eye: No discharge.     Conjunctiva/sclera: Conjunctivae normal.  Cardiovascular:     Rate and Rhythm: Normal rate and regular rhythm.     Heart sounds: Normal heart sounds.  Pulmonary:     Effort: Pulmonary effort is normal. No respiratory distress.     Breath sounds: Normal breath sounds.  Abdominal:     Palpations: Abdomen is soft.     Tenderness: There is no abdominal tenderness. There is no right CVA tenderness or left CVA tenderness.  Musculoskeletal:     Cervical back: Neck supple.  Skin:    General: Skin is dry.  Neurological:     General: No focal deficit present.     Mental Status: She is alert and oriented to person, place, and time. Mental status is at baseline.     Cranial Nerves: No cranial nerve deficit.     Motor: No weakness.     Coordination: Coordination normal.     Gait: Gait normal.     Comments: 5/5 strength bilateral upper and lower extremities   Psychiatric:        Mood and Affect: Mood normal.        Behavior: Behavior normal.      UC Treatments / Results  Labs (all labs ordered are listed, but only abnormal results are displayed) Labs Reviewed  URINALYSIS, W/ REFLEX TO CULTURE (INFECTION SUSPECTED) - Abnormal; Notable for the following components:      Result Value   APPearance HAZY (*)    Bilirubin Urine  SMALL (*)    Ketones, ur TRACE (*)    Leukocytes,Ua SMALL (*)    Bacteria, UA MANY (*)    All other components within normal limits  URINE CULTURE  GLUCOSE, CAPILLARY  CBG MONITORING, ED    EKG   Radiology No results found.  Procedures ED EKG  Date/Time: 11/12/2023 9:22 AM  Performed by: Arvis Jolan NOVAK, PA-C Authorized by: Arvis Jolan NOVAK, PA-C   Previous ECG:    Previous ECG:  Compared to current   Similarity:  Changes noted   Comparison ECG info:  PVCs now Interpretation:    Interpretation: abnormal   Rate:    ECG rate:  72   ECG rate assessment: normal   Rhythm:    Rhythm: sinus rhythm   Ectopy:    Ectopy: PVCs     PVCs:  Infrequent QRS:    QRS axis:  Normal   QRS intervals:  Normal   QRS conduction: normal   ST segments:    ST segments:  Normal T waves:    T waves: normal   Comments:     Sinus rhythm with occasional PVCs. Compared to 2023 EKG--only change is PVCs  (including critical care time)  Medications Ordered in UC Medications - No data to display  Initial Impression / Assessment and Plan / UC Course  I have reviewed the triage vital signs and the nursing notes.  Pertinent labs & imaging results that were available during my care of the patient were reviewed by me and considered in my medical decision making (see chart for details).    63 y/o female with a history of anxiety, hypertension, hyperlipidemia, palpitations, Sjorgren's, OSA, tobacco abuse, chronic bronchitis, chronic venous insufficiency, depression, OA, and pre-diabetes presents for dizziness/lightheadedness and blurred vision x 2 days. States she has not taken her BP medication due to these symptoms.   Patient also reports urinary frequency.   Vitals are all normal and stable. Normal HEENT exam.  Normal neuro exam. Chest clear. Heart RRR. No leg swelling.  EKG: Sinus rhythm with occasional PVCs.  PVC is not present on 2023 EKG.  UA obtained. Hazy urine with small leuks and many  bacteria. Will send urine for culture.   Fingerstick glucose: 80  Visual acuity--grossly intact  UTI. Will treat with cefdinir  and amend treatment based on culture if needed. Encouraged increasing rest and fluids.   Dizziness, palpitations, nausea, blurred vision (with intermittent dizziness). Unclear if these symptoms are due to UTI or entirely separate. She has PVCs. Explained this to patient. Advised her the safest plan is to be on a heart monitor, have labs and further workup for dizziness as I cannot confidently attribute solely to UTI.  Patient reports she does not want to go to the ED.  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  She will monitor symptoms closely and if not improving in a couple days after beginning the antibiotic for UTI or has any acute worsening of symptoms will go to ED.  Discussed risks of avoiding going to ED including life-threatening complications.  Patient is understanding and agreeable.   Final Clinical Impressions(s) / UC Diagnoses   Final diagnoses:  Dizziness  Lower urinary tract infectious disease  PVC's (premature ventricular contractions)  Prediabetes  Palpitations     Discharge Instructions      - Your blood pressure and other vital signs are all normal. - Your blood sugar is normal at 80. - Your EKG shows PVCs.  A lot of PVCs can make you feel dizzy or fatigued and like you could pass out especially if they are in a row.  As discussed, I am unable to keep you on a monitor to further evaluate that.  We discussed potentially going to the ER especially if symptoms are worsening or if they are not improving so that you can be on a monitor and have other labs performed. - The urine sample you left does look like a urinary tract infection which can also make you feel dizzy.  I sent an antibiotic to the pharmacy.  You should be starting to improve over the next 24 hours and definitely within the next 2 days.  If  symptoms have not improved you certainly need to go to the ER. - If at any point you have chest pain/tightness, increased dizziness, fall, feel more faint, racing heart/fluttering in your chest does not go away within a few seconds, you have vomiting, numbness/tingling, weakness, leg swelling, severe headaches, worsening vision, excetra you should call 911 or have someone immediately take you to the ER.   UTI: Based on either symptoms or urinalysis, you may have a urinary tract infection. We will send the urine for culture and call with results in a few days. Begin antibiotics at this time. Your symptoms should be much improved over the next 2-3 days. Increase rest and fluid intake. If for some reason symptoms are worsening or not improving after a couple of days or the urine culture determines the antibiotics you are taking will not treat the infection, the antibiotics may be changed. Return or go to ER for fever, back pain, worsening urinary pain, discharge, increased blood in urine. May take Tylenol  or Motrin  OTC for pain relief or consider AZO if no contraindications      ED Prescriptions     Medication Sig Dispense Auth. Provider   cefdinir  (OMNICEF ) 300 MG capsule Take 1 capsule (  300 mg total) by mouth 2 (two) times daily for 7 days. 14 capsule Arvis Jolan NOVAK, PA-C      PDMP not reviewed this encounter.   Arvis Jolan NOVAK, PA-C 11/12/23 [id number]

## 2023-11-13 LAB — URINE CULTURE

## 2023-11-17 ENCOUNTER — Ambulatory Visit (HOSPITAL_COMMUNITY): Payer: Self-pay
